# Patient Record
Sex: Female | Born: 1995 | Race: White | Hispanic: No | Marital: Single | Smoking: Never smoker
Health system: Southern US, Community
[De-identification: ages and names within clinical notes are randomized; demographics above are authoritative.]

## PROBLEM LIST (undated history)

## (undated) DIAGNOSIS — G43909 Migraine, unspecified, not intractable, without status migrainosus: Secondary | ICD-10-CM

## (undated) DIAGNOSIS — N83209 Unspecified ovarian cyst, unspecified side: Secondary | ICD-10-CM

## (undated) HISTORY — PX: WISDOM TOOTH EXTRACTION: SHX21

## (undated) HISTORY — PX: APPENDECTOMY: SHX54

---
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
## 2019-02-09 ENCOUNTER — Emergency Department: Payer: Medicaid Other

## 2019-02-09 ENCOUNTER — Emergency Department
Admission: EM | Admit: 2019-02-09 | Discharge: 2019-02-09 | Disposition: A | Discharge status: Home / Self Care | Payer: Medicaid Other | Attending: Student in an Organized Health Care Education/Training Program | Admitting: Student in an Organized Health Care Education/Training Program

## 2019-02-09 ENCOUNTER — Other Ambulatory Visit: Payer: Self-pay

## 2019-02-09 DIAGNOSIS — N939 Abnormal uterine and vaginal bleeding, unspecified: Secondary | ICD-10-CM

## 2019-02-09 HISTORY — DX: Migraine, unspecified, not intractable, without status migrainosus: G43.909

## 2019-02-09 HISTORY — DX: Unspecified ovarian cyst, unspecified side: N83.209

## 2019-02-09 LAB — CBC
HCT: 38.5 % (ref 36.0–46.0)
Hemoglobin: 12.6 g/dL (ref 12.0–15.0)
MCH: 29.2 pg (ref 26.0–34.0)
MCHC: 32.7 g/dL (ref 30.0–36.0)
MCV: 89.3 fL (ref 80.0–100.0)
Platelets: 454 10*3/uL — ABNORMAL HIGH (ref 150–400)
RBC: 4.31 MIL/uL (ref 3.87–5.11)
RDW: 12.9 % (ref 11.5–15.5)
WBC: 13.5 10*3/uL — ABNORMAL HIGH (ref 4.0–10.5)
nRBC: 0 % (ref 0.0–0.2)

## 2019-02-09 LAB — BASIC METABOLIC PANEL
Anion gap: 9 (ref 5–15)
BUN: 7 mg/dL (ref 6–20)
CO2: 23 mmol/L (ref 22–32)
Calcium: 9 mg/dL (ref 8.9–10.3)
Chloride: 107 mmol/L (ref 98–111)
Creatinine, Ser: 0.57 mg/dL (ref 0.44–1.00)
GFR calc Af Amer: >60 mL/min (ref >60)
GFR calc non Af Amer: >60 mL/min (ref >60)
Glucose, Bld: 112 mg/dL — ABNORMAL HIGH (ref 70–99)
Potassium: 3.5 mmol/L (ref 3.5–5.1)
Sodium: 139 mmol/L (ref 135–145)

## 2019-02-09 LAB — URINALYSIS, COMPLETE (UACMP) WITH MICROSCOPIC
Bacteria, UA: NONE SEEN
Bilirubin Urine: NEGATIVE
Glucose, UA: NEGATIVE mg/dL
Ketones, ur: NEGATIVE mg/dL
Leukocytes,Ua: NEGATIVE
Nitrite: NEGATIVE
Protein, ur: NEGATIVE mg/dL
Specific Gravity, Urine: 1.014 (ref 1.005–1.030)
pH: 5 (ref 5.0–8.0)

## 2019-02-09 LAB — HCG, QUANTITATIVE, PREGNANCY: hCG, Beta Chain, Quant, S: <1 m[IU]/mL (ref <5)

## 2019-02-09 MED ORDER — PROMETHAZINE HCL 25 MG PO TABS: 25.0000 mg | ORAL_TABLET | Freq: Four times a day (QID) | ORAL | 0 refills | Status: DC | PRN

## 2019-02-09 MED ORDER — TRANEXAMIC ACID 650 MG PO TABS: 1300.0000 mg | ORAL_TABLET | Freq: Three times a day (TID) | ORAL | 0 refills | Status: AC

## 2019-02-09 NOTE — ED Notes (Signed)
Patient transported to Ultrasound 

## 2019-02-09 NOTE — ED Triage Notes (Signed)
Pt states she normally has her periods in the first week of the month, states she had a slightly positive home pregnancy test, but today started having heavy vaginal bleeding with abd cramping today. States she is passing clots.

## 2019-02-09 NOTE — ED Notes (Signed)
Esign not working at this time. Pt verbalized discharge instructions and has no questions at this time. 

## 2019-02-09 NOTE — ED Provider Notes (Signed)
[facility] Emergency Department Provider Note    First MD Initiated Contact with Patient 02/09/19 1641     (approximate)  I have reviewed the triage vital signs and the nursing notes.   HISTORY  Chief Complaint Vaginal Bleeding    HPI Ellen Hart is a 23 y.o. female below listed past medical history presents the ER for heavy vaginal bleeding that started this morning.  States that she was 2 weeks late for her menstrual cycle.  States that she did take a home pregnancy test which was reportedly weakly positive a week ago but took another one this morning when she started having cramping and abdominal pain and it was negative.  She denies blood thinners. Oral contraceptive pills.  Does have a history of miscarriage several years ago.    Past Medical History:  Diagnosis Date  . Migraine   . Ovarian cyst    No family history on file. Past Surgical History:  Procedure Laterality Date  . APPENDECTOMY    . WISDOM TOOTH EXTRACTION     There are no active problems to display for this patient.     Prior to Admission medications   Medication Sig Start Date End Date Taking? Authorizing Provider  promethazine (PHENERGAN) 25 MG tablet Take 1 tablet (25 mg total) by mouth every 6 (six) hours as needed. 02/09/19   Ellen Hart, Keltin Baird, MD  tranexamic acid (LYSTEDA) 650 MG TABS tablet Take 2 tablets (1,300 mg total) by mouth 3 (three) times daily for 5 days. 02/09/19 02/14/19  Ellen Hart, Ellen Glandon, MD    Allergies Amoxicillin    Social History Social History   Tobacco Use  . Smoking status: Never Smoker  . Smokeless tobacco: Never Used  Substance Use Topics  . Alcohol use: Yes  . Drug use: Not Currently    Review of Systems Patient denies headaches, rhinorrhea, blurry vision, numbness, shortness of breath, chest pain, edema, cough, abdominal pain, nausea, vomiting, diarrhea, dysuria, fevers, rashes or hallucinations unless otherwise stated above in HPI.  ____________________________________________   PHYSICAL EXAM:  VITAL SIGNS: Vitals:   02/09/19 1449  BP: (!) 143/66  Pulse: 96  Resp: 18  Temp: 98.7 F (37.1 C)  SpO2: 98%    Constitutional: Alert and oriented.  Eyes: Conjunctivae are normal.  Head: Atraumatic. Nose: No congestion/rhinnorhea. Mouth/Throat: Mucous membranes are moist.   Neck: No stridor. Painless ROM.  Cardiovascular: Normal rate, regular rhythm. Grossly normal heart sounds.  Good peripheral circulation. Respiratory: Normal respiratory effort.  No retractions. Lungs CTAB. Gastrointestinal: Soft and nontender. No distention. No abdominal bruits. No CVA tenderness. Genitourinary: Small amount of oozing from the cervical loss.  No cervical motion tenderness erythema or laceration.  No masses.  No purulent discharge. Musculoskeletal: No lower extremity tenderness nor edema.  No joint effusions. Neurologic:  Normal speech and language. No gross focal neurologic deficits are appreciated. No facial droop Skin:  Skin is warm, dry and intact. No rash noted. Psychiatric: Mood and affect are normal. Speech and behavior are normal.  ____________________________________________   LABS (all labs ordered are listed, but only abnormal results are displayed)  Results for orders placed or performed during the hospital encounter of 02/09/19 (from the past 24 hour(s))  CBC     Status: Abnormal   Collection Time: 02/09/19  2:55 PM  Result Value Ref Range   WBC 13.5 (H) 4.0 - 10.5 K/uL   RBC 4.31 3.87 - 5.11 MIL/uL   Hemoglobin 12.6 12.0 - 15.0 g/dL  HCT 38.5 36.0 - 46.0 %   MCV 89.3 80.0 - 100.0 fL   MCH 29.2 26.0 - 34.0 pg   MCHC 32.7 30.0 - 36.0 g/dL   RDW 38.1 01.7 - 51.0 %   Platelets 454 (H) 150 - 400 K/uL   nRBC 0.0 0.0 - 0.2 %  Basic metabolic panel     Status: Abnormal   Collection Time: 02/09/19  2:55 PM  Result Value Ref Range   Sodium 139 135 - 145 mmol/L   Potassium 3.5 3.5 - 5.1 mmol/L   Chloride 107  98 - 111 mmol/L   CO2 23 22 - 32 mmol/L   Glucose, Bld 112 (H) 70 - 99 mg/dL   BUN 7 6 - 20 mg/dL   Creatinine, Ser 2.58 0.44 - 1.00 mg/dL   Calcium 9.0 8.9 - 52.7 mg/dL   GFR calc non Af Amer >60 >60 mL/min   GFR calc Af Amer >60 >60 mL/min   Anion gap 9 5 - 15  hCG, quantitative, pregnancy     Status: None   Collection Time: 02/09/19  2:55 PM  Result Value Ref Range   hCG, Beta Chain, Quant, S <1 <5 mIU/mL  Urinalysis, Complete w Microscopic     Status: Abnormal   Collection Time: 02/09/19  2:55 PM  Result Value Ref Range   Color, Urine YELLOW (A) YELLOW   APPearance CLEAR (A) CLEAR   Specific Gravity, Urine 1.014 1.005 - 1.030   pH 5.0 5.0 - 8.0   Glucose, UA NEGATIVE NEGATIVE mg/dL   Hgb urine dipstick LARGE (A) NEGATIVE   Bilirubin Urine NEGATIVE NEGATIVE   Ketones, ur NEGATIVE NEGATIVE mg/dL   Protein, ur NEGATIVE NEGATIVE mg/dL   Nitrite NEGATIVE NEGATIVE   Leukocytes,Ua NEGATIVE NEGATIVE   RBC / HPF 0-5 0 - 5 RBC/hpf   WBC, UA 0-5 0 - 5 WBC/hpf   Bacteria, UA NONE SEEN NONE SEEN   Squamous Epithelial / LPF 0-5 0 - 5   Mucus PRESENT    ____________________________________________ ____________________________________________  RADIOLOGY  I personally reviewed all radiographic images ordered to evaluate for the above acute complaints and reviewed radiology reports and findings.  These findings were personally discussed with the patient.  Please see medical record for radiology report.  ____________________________________________   PROCEDURES  Procedure(s) performed:  Procedures    Critical Care performed: no ____________________________________________   INITIAL IMPRESSION / ASSESSMENT AND PLAN / ED COURSE  Pertinent labs & imaging results that were available during my care of the patient were reviewed by me and considered in my medical decision making (see chart for details).   DDX: aub, dub, ectopic, retained poc  [name] is a 23 y.o. who  presents to the ED with symptoms as described above.  Patient afebrile hemodynamically stable.  Nontoxic appearing.  Abdominal exam is benign.  Clinical Course as of Feb 09 1935  Tue Feb 09, 2019  1739 Pelvic exam with retained products or cervical abnormality.  Based on her bleeding with possible miscarriage will order ultrasound to evaluate for anatomic abnormality.   [PR]  1932 Ultrasound is reassuring.  After discussing options for treatment patient has elected a trial of Lysteda and requesting outpatient referral which I think is perfectly reasonable.  Have discussed with the patient and available family all diagnostics and treatments performed thus far and all questions were answered to the best of my ability. The patient demonstrates understanding and agreement with plan.    [PR]  Clinical Course User Index [PR] Merlyn Lot, MD    The patient was evaluated in Emergency Department today for the symptoms described in the history of present illness. He/she was evaluated in the context of the global COVID-19 pandemic, which necessitated consideration that the patient might be at risk for infection with the SARS-CoV-2 virus that causes COVID-19. Institutional protocols and algorithms that pertain to the evaluation of patients at risk for COVID-19 are in a state of rapid change based on information released by regulatory bodies including the CDC and federal and state organizations. These policies and algorithms were followed during the patient's care in the ED.  As part of my medical decision making, I reviewed the following data within the [place] notes reviewed and incorporated, Labs reviewed, notes from prior ED visits and [place] Controlled Substance Database   ____________________________________________   FINAL CLINICAL IMPRESSION(S) / ED DIAGNOSES  Final diagnoses:  Episode of heavy vaginal bleeding      NEW MEDICATIONS STARTED DURING THIS VISIT:   New Prescriptions   PROMETHAZINE (PHENERGAN) 25 MG TABLET    Take 1 tablet (25 mg total) by mouth every 6 (six) hours as needed.   TRANEXAMIC ACID (LYSTEDA) 650 MG TABS TABLET    Take 2 tablets (1,300 mg total) by mouth 3 (three) times daily for 5 days.     Note:  This document was prepared using Dragon voice recognition software and may include unintentional dictation errors.    Merlyn Lot, MD 02/09/19 [phone]

## 2019-04-10 ENCOUNTER — Other Ambulatory Visit: Payer: Self-pay

## 2019-04-10 ENCOUNTER — Encounter: Payer: Self-pay | Admitting: Emergency Medicine

## 2019-04-10 DIAGNOSIS — N760 Acute vaginitis: Secondary | ICD-10-CM | POA: Insufficient documentation

## 2019-04-10 DIAGNOSIS — O26891 Other specified pregnancy related conditions, first trimester: Secondary | ICD-10-CM | POA: Insufficient documentation

## 2019-04-10 DIAGNOSIS — Z3A01 Less than 8 weeks gestation of pregnancy: Secondary | ICD-10-CM | POA: Insufficient documentation

## 2019-04-10 DIAGNOSIS — R1032 Left lower quadrant pain: Secondary | ICD-10-CM | POA: Diagnosis not present

## 2019-04-10 DIAGNOSIS — B9689 Other specified bacterial agents as the cause of diseases classified elsewhere: Secondary | ICD-10-CM | POA: Insufficient documentation

## 2019-04-10 LAB — COMPREHENSIVE METABOLIC PANEL
ALT: 13 U/L (ref 0–44)
AST: 13 U/L — ABNORMAL LOW (ref 15–41)
Albumin: 3.8 g/dL (ref 3.5–5.0)
Alkaline Phosphatase: 83 U/L (ref 38–126)
Anion gap: 9 (ref 5–15)
BUN: 9 mg/dL (ref 6–20)
CO2: 22 mmol/L (ref 22–32)
Calcium: 8.9 mg/dL (ref 8.9–10.3)
Chloride: 106 mmol/L (ref 98–111)
Creatinine, Ser: 0.56 mg/dL (ref 0.44–1.00)
GFR calc Af Amer: >60 mL/min (ref >60)
GFR calc non Af Amer: >60 mL/min (ref >60)
Glucose, Bld: 100 mg/dL — ABNORMAL HIGH (ref 70–99)
Potassium: 3.8 mmol/L (ref 3.5–5.1)
Sodium: 137 mmol/L (ref 135–145)
Total Bilirubin: 0.5 mg/dL (ref 0.3–1.2)
Total Protein: 7.4 g/dL (ref 6.5–8.1)

## 2019-04-10 LAB — CBC
HCT: 34.7 % — ABNORMAL LOW (ref 36.0–46.0)
Hemoglobin: 11.8 g/dL — ABNORMAL LOW (ref 12.0–15.0)
MCH: 28.6 pg (ref 26.0–34.0)
MCHC: 34 g/dL (ref 30.0–36.0)
MCV: 84.2 fL (ref 80.0–100.0)
Platelets: 403 10*3/uL — ABNORMAL HIGH (ref 150–400)
RBC: 4.12 MIL/uL (ref 3.87–5.11)
RDW: 12.4 % (ref 11.5–15.5)
WBC: 12.5 10*3/uL — ABNORMAL HIGH (ref 4.0–10.5)
nRBC: 0 % (ref 0.0–0.2)

## 2019-04-10 LAB — URINALYSIS, COMPLETE (UACMP) WITH MICROSCOPIC
Bilirubin Urine: NEGATIVE
Glucose, UA: NEGATIVE mg/dL
Hgb urine dipstick: NEGATIVE
Ketones, ur: NEGATIVE mg/dL
Leukocytes,Ua: NEGATIVE
Nitrite: NEGATIVE
Protein, ur: NEGATIVE mg/dL
Specific Gravity, Urine: 1.01 (ref 1.005–1.030)
pH: 6 (ref 5.0–8.0)

## 2019-04-10 LAB — LIPASE, BLOOD: Lipase: 25 U/L (ref 11–51)

## 2019-04-10 LAB — POCT PREGNANCY, URINE: Preg Test, Ur: POSITIVE — AB

## 2019-04-10 NOTE — ED Triage Notes (Signed)
Patient states that she had a at home positive pregnancy test yesterday and today. Patient states that today around noon she started having lower abdominal pain. Patient denies vaginal bleeding or urinary symptoms.

## 2019-04-11 ENCOUNTER — Emergency Department
Admission: EM | Admit: 2019-04-11 | Discharge: 2019-04-11 | Disposition: A | Discharge status: Home / Self Care | Payer: Medicaid Other | Attending: Emergency Medicine | Admitting: Emergency Medicine

## 2019-04-11 ENCOUNTER — Emergency Department: Payer: Medicaid Other

## 2019-04-11 DIAGNOSIS — O3680X0 Pregnancy with inconclusive fetal viability, not applicable or unspecified: Secondary | ICD-10-CM

## 2019-04-11 DIAGNOSIS — B9689 Other specified bacterial agents as the cause of diseases classified elsewhere: Secondary | ICD-10-CM

## 2019-04-11 DIAGNOSIS — O26891 Other specified pregnancy related conditions, first trimester: Secondary | ICD-10-CM

## 2019-04-11 DIAGNOSIS — R109 Unspecified abdominal pain: Secondary | ICD-10-CM

## 2019-04-11 DIAGNOSIS — N760 Acute vaginitis: Secondary | ICD-10-CM

## 2019-04-11 DIAGNOSIS — R102 Pelvic and perineal pain: Secondary | ICD-10-CM

## 2019-04-11 LAB — WET PREP, GENITAL
Sperm: NONE SEEN
Trich, Wet Prep: NONE SEEN
Yeast Wet Prep HPF POC: NONE SEEN

## 2019-04-11 LAB — CHLAMYDIA/NGC RT PCR (ARMC ONLY)
Chlamydia Tr: NOT DETECTED
N gonorrhoeae: DETECTED — AB

## 2019-04-11 LAB — HCG, QUANTITATIVE, PREGNANCY: hCG, Beta Chain, Quant, S: 700 m[IU]/mL — ABNORMAL HIGH (ref <5)

## 2019-04-11 MED ORDER — METRONIDAZOLE 500 MG PO TABS
500.0000 mg | ORAL_TABLET | Freq: Once | ORAL | Status: AC
  Administered 2019-04-11: 500 mg via ORAL
  Filled 2019-04-11: qty 1

## 2019-04-11 MED ORDER — METRONIDAZOLE 500 MG PO TABS: 500.0000 mg | ORAL_TABLET | Freq: Two times a day (BID) | ORAL | 0 refills | Status: AC

## 2019-04-11 NOTE — ED Notes (Signed)
Patient transported to Ultrasound 

## 2019-04-11 NOTE — Discharge Instructions (Addendum)
As we discussed, we are unable to rule out an ectopic pregnancy at this time.  This means a pregnancy outside of the uterus which can cause significant problems including life-threatening bleeding.  Therefore it is very important that you follow-up with OB/GYN in 48 hours for repeat blood work and ultrasound.  If your OB is unable to see you within 48 hours and you are still having pain please return to the emergency room.  If the pain has resolved it is okay to wait a week to see your OB.  In the meantime take over-the-counter prenatal vitamins with folic acid.  If you have severe or new abdominal pain or vaginal bleeding please return to the emergency room immediately for reevaluation.

## 2019-04-11 NOTE — ED Provider Notes (Signed)
[facility] Emergency Department Provider Note  ____________________________________________  Time seen: Approximately 2:57 AM  I have reviewed the triage vital signs and the nursing notes.   HISTORY  Chief Complaint Abdominal Pain   HPI Ellen Hart is a 23 y.o. female G3P0 at  [redacted] weeks GA per LMP who presents for evaluation of abdominal pain.  Patient reports having 2+ pregnancy tests at home.  Earlier today she started having left lower quadrant abdominal pain that she describes as a tearing sensation.  At this time the pain has significantly improved and she has mild soreness.  No vaginal discharge or vaginal bleeding, no dysuria or hematuria, no diarrhea, no nausea or vomiting, no constipation.  Patient reports 2 prior spontaneous miscarriages.  Past Medical History:  Diagnosis Date   Migraine    Ovarian cyst     There are no active problems to display for this patient.   Past Surgical History:  Procedure Laterality Date   APPENDECTOMY     WISDOM TOOTH EXTRACTION      Prior to Admission medications   Medication Sig Start Date End Date Taking? Authorizing Provider  metroNIDAZOLE (FLAGYL) 500 MG tablet Take 1 tablet (500 mg total) by mouth 2 (two) times daily for 7 days. 04/11/19 04/18/19  Rudene Re, MD  promethazine (PHENERGAN) 25 MG tablet Take 1 tablet (25 mg total) by mouth every 6 (six) hours as needed. 02/09/19   Merlyn Lot, MD    Allergies Amoxicillin  No family history on file.  Social History Social History   Tobacco Use   Smoking status: Never Smoker   Smokeless tobacco: Never Used  Substance Use Topics   Alcohol use: Yes    Comment: occ   Drug use: Not Currently    Review of Systems  Constitutional: Negative for fever. Eyes: Negative for visual changes. ENT: Negative for sore throat. Neck: No neck pain  Cardiovascular: Negative for chest pain. Respiratory: Negative for shortness of  breath. Gastrointestinal: + LLQ abdominal pain. No vomiting or diarrhea. Genitourinary: Negative for dysuria. Musculoskeletal: Negative for back pain. Skin: Negative for rash. Neurological: Negative for headaches, weakness or numbness. Psych: No SI or HI  ____________________________________________   PHYSICAL EXAM:  VITAL SIGNS: ED Triage Vitals [04/10/19 2325]  Enc Vitals Group     BP 126/72     Pulse Rate 98     Resp 18     Temp 98.9 F (37.2 C)     Temp Source Oral     SpO2 99 %     Weight 187 lb (84.8 kg)     Height 5' 3.5" (1.613 m)     Head Circumference      Peak Flow      Pain Score 5     Pain Loc      Pain Edu?      Excl. in [place]?     Constitutional: Alert and oriented. Well appearing and in no apparent distress. HEENT:      Head: Normocephalic and atraumatic.         Eyes: Conjunctivae are normal. Sclera is non-icteric.       Mouth/Throat: Mucous membranes are moist.       Neck: Supple with no signs of meningismus. Cardiovascular: Regular rate and rhythm. No murmurs, gallops, or rubs. 2+ symmetrical distal pulses are present in all extremities. No JVD. Respiratory: Normal respiratory effort. Lungs are clear to auscultation bilaterally. No wheezes, crackles, or rhonchi.  Gastrointestinal: Soft, mild ttp over  the LLQ, and non distended with positive bowel sounds. No rebound or guarding. Pelvic exam: Normal external genitalia, no rashes or lesions. Normal cervical mucus. Os closed. No cervical motion tenderness.  Tender to palpation over the L adnexa Musculoskeletal: Nontender with normal range of motion in all extremities. No edema, cyanosis, or erythema of extremities. Neurologic: Normal speech and language. Face is symmetric. Moving all extremities. No gross focal neurologic deficits are appreciated. Skin: Skin is warm, dry and intact. No rash noted. Psychiatric: Mood and affect are normal. Speech and behavior are  normal.  ____________________________________________   LABS (all labs ordered are listed, but only abnormal results are displayed)  Labs Reviewed  WET PREP, GENITAL - Abnormal; Notable for the following components:      Result Value   Clue Cells Wet Prep HPF POC PRESENT (*)    WBC, Wet Prep HPF POC FEW (*)    All other components within normal limits  COMPREHENSIVE METABOLIC PANEL - Abnormal; Notable for the following components:   Glucose, Bld 100 (*)    AST 13 (*)    All other components within normal limits  CBC - Abnormal; Notable for the following components:   WBC 12.5 (*)    Hemoglobin 11.8 (*)    HCT 34.7 (*)    Platelets 403 (*)    All other components within normal limits  URINALYSIS, COMPLETE (UACMP) WITH MICROSCOPIC - Abnormal; Notable for the following components:   Color, Urine STRAW (*)    APPearance CLEAR (*)    Bacteria, UA RARE (*)    All other components within normal limits  HCG, QUANTITATIVE, PREGNANCY - Abnormal; Notable for the following components:   hCG, Beta Chain, Quant, S 700 (*)    All other components within normal limits  POCT PREGNANCY, URINE - Abnormal; Notable for the following components:   Preg Test, Ur POSITIVE (*)    All other components within normal limits  CHLAMYDIA/NGC RT PCR ([facility] ONLY)  LIPASE, BLOOD  POC URINE PREG, ED   ____________________________________________  EKG  none  ____________________________________________  RADIOLOGY  I have personally reviewed the images performed during this visit and I agree with the Radiologist's read.   Interpretation by Radiologist:  US Ob Less Than 14 Weeks With Ob Transvaginal  Result Date: 04/11/2019 CLINICAL DATA:  Pelvic pain EXAM: OBSTETRIC <14 WK [place] AND TRANSVAGINAL OB US TECHNIQUE: Both transabdominal and transvaginal ultrasound examinations were performed for complete evaluation of the gestation as well as the maternal uterus, adnexal regions, and pelvic  cul-de-sac. Transvaginal technique was performed to assess early pregnancy. COMPARISON:  None. FINDINGS: Intrauterine gestational sac: None Yolk sac:  Not visualized Embryo:  Not visualized Cardiac Activity: Heart Rate:   bpm MSD:   mm    w     d CRL:    mm    w    d                  [place] EDC: Subchorionic hemorrhage:  None visualized. Maternal uterus/adnexae: No adnexal mass.  Trace free fluid. IMPRESSION: No intrauterine pregnancy visualized. Differential considerations would include early intrauterine pregnancy too early to visualize, spontaneous abortion, or occult ectopic pregnancy. Recommend close clinical followup and serial quantitative beta HCGs and ultrasounds. Electronically Signed   By: Charlett Nose M.D.   On: 04/11/2019 02:19     ____________________________________________   PROCEDURES  Procedure(s) performed: None Procedures Critical Care performed:  None ____________________________________________   INITIAL IMPRESSION / ASSESSMENT AND PLAN /  ED COURSE  23 y.o. female G3P0 at  [redacted] weeks GA per LMP who presents for evaluation of LLQ abdominal pain and a + home pregnancy test. LMP 5 weeks ago. hcg 700. Pelvic US unable to visualize a pregnancy at this time.  On exam she has mild left adnexal tenderness but patient is otherwise well-appearing with a nonrigid and benign abdominal exam.  Discussed with the radiologist Dr. Kearney Hardover who sees normal blood flow to the left ovary with no signs of torsion.  There are no cysts seen on the ovary as well.  Discussed with the patient that at this time we are unable to rule out an ectopic pregnancy and therefore it is imperative that she has follow-up in 48 hours for repeat ultrasound and beta quant.  Patient follows up with her notable OB/GYN.  I told her that if she is unable to get in with OB/GYN that she should return to the emergency room in 48 hours or sooner if she has severe abdominal pain or syncope.  Discussed prenatal care with patient.  Wet prep  positive for BV for which patient was started on Flagyl.  GC and Chlamydia are pending.      As part of my medical decision making, I reviewed the following data within the electronic MEDICAL RECORD NUMBER Nursing notes reviewed and incorporated, Labs reviewed , Old chart reviewed, Radiograph reviewed , Discussed with radiologist, Notes from prior ED visits and [place] Controlled Substance Database   Please note:  Patient was evaluated in Emergency Department today for the symptoms described in the history of present illness. Patient was evaluated in the context of the global COVID-19 pandemic, which necessitated consideration that the patient might be at risk for infection with the SARS-CoV-2 virus that causes COVID-19. Institutional protocols and algorithms that pertain to the evaluation of patients at risk for COVID-19 are in a state of rapid change based on information released by regulatory bodies including the CDC and federal and state organizations. These policies and algorithms were followed during the patient's care in the ED.  Some ED evaluations and interventions may be delayed as a result of limited staffing during the pandemic.   ____________________________________________   FINAL CLINICAL IMPRESSION(S) / ED DIAGNOSES   Final diagnoses:  Abdominal pain during pregnancy in first trimester  Pregnancy, location unknown  BV (bacterial vaginosis)      NEW MEDICATIONS STARTED DURING THIS VISIT:  ED Discharge Orders         Ordered    metroNIDAZOLE (FLAGYL) 500 MG tablet  2 times daily     04/11/19 0341           Note:  This document was prepared using Dragon voice recognition software and may include unintentional dictation errors.    Don PerkingVeronese, [place], MD 04/11/19 [phone]

## 2019-04-11 NOTE — ED Notes (Signed)
Pt may drink per EDP. Pt given water per pt's request. Pt tolerating well.

## 2019-04-12 ENCOUNTER — Telehealth: Payer: Self-pay | Admitting: Emergency Medicine

## 2019-04-12 NOTE — Telephone Encounter (Signed)
Called patient and she is aware of std results.  She is trying to get in touch with her doctor--schermerhorn and is having trouble gettign through.   Told her to just wait for call to be answered and  It sometimes takes >15 minutes. Told her that she can also be seen at [facility] to have hcg and std treatment.

## 2019-04-26 ENCOUNTER — Other Ambulatory Visit: Payer: Self-pay

## 2019-04-26 ENCOUNTER — Encounter: Payer: Self-pay | Admitting: Emergency Medicine

## 2019-04-26 ENCOUNTER — Emergency Department
Admission: EM | Admit: 2019-04-26 | Discharge: 2019-04-26 | Disposition: A | Discharge status: Home / Self Care | Payer: Medicaid Other | Attending: Emergency Medicine | Admitting: Emergency Medicine

## 2019-04-26 DIAGNOSIS — O9A211 Injury, poisoning and certain other consequences of external causes complicating pregnancy, first trimester: Secondary | ICD-10-CM | POA: Diagnosis not present

## 2019-04-26 DIAGNOSIS — Z3A01 Less than 8 weeks gestation of pregnancy: Secondary | ICD-10-CM | POA: Insufficient documentation

## 2019-04-26 DIAGNOSIS — R109 Unspecified abdominal pain: Secondary | ICD-10-CM | POA: Insufficient documentation

## 2019-04-26 LAB — URINALYSIS, COMPLETE (UACMP) WITH MICROSCOPIC
Bilirubin Urine: NEGATIVE
Glucose, UA: NEGATIVE mg/dL
Hgb urine dipstick: NEGATIVE
Ketones, ur: NEGATIVE mg/dL
Leukocytes,Ua: NEGATIVE
Nitrite: NEGATIVE
Protein, ur: NEGATIVE mg/dL
Specific Gravity, Urine: 1.021 (ref 1.005–1.030)
pH: 5 (ref 5.0–8.0)

## 2019-04-26 LAB — POCT PREGNANCY, URINE: Preg Test, Ur: POSITIVE — AB

## 2019-04-26 NOTE — ED Notes (Signed)
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
## 2019-05-02 ENCOUNTER — Emergency Department
Admission: EM | Admit: 2019-05-02 | Discharge: 2019-05-02 | Disposition: A | Discharge status: Home / Self Care | Payer: Medicaid Other | Attending: Emergency Medicine | Admitting: Emergency Medicine

## 2019-05-02 ENCOUNTER — Other Ambulatory Visit: Payer: Self-pay

## 2019-05-02 DIAGNOSIS — G44209 Tension-type headache, unspecified, not intractable: Secondary | ICD-10-CM | POA: Diagnosis not present

## 2019-05-02 DIAGNOSIS — Z20828 Contact with and (suspected) exposure to other viral communicable diseases: Secondary | ICD-10-CM | POA: Insufficient documentation

## 2019-05-02 DIAGNOSIS — R519 Headache, unspecified: Secondary | ICD-10-CM | POA: Diagnosis present

## 2019-05-02 DIAGNOSIS — Z20822 Contact with and (suspected) exposure to covid-19: Secondary | ICD-10-CM

## 2019-05-02 LAB — CBC WITH DIFFERENTIAL/PLATELET
Abs Immature Granulocytes: 0.03 10*3/uL (ref 0.00–0.07)
Basophils Absolute: 0 10*3/uL (ref 0.0–0.1)
Basophils Relative: 0 %
Eosinophils Absolute: 0 10*3/uL (ref 0.0–0.5)
Eosinophils Relative: 0 %
HCT: 35.9 % — ABNORMAL LOW (ref 36.0–46.0)
Hemoglobin: 12 g/dL (ref 12.0–15.0)
Immature Granulocytes: 1 %
Lymphocytes Relative: 14 %
Lymphs Abs: 0.8 10*3/uL (ref 0.7–4.0)
MCH: 28.8 pg (ref 26.0–34.0)
MCHC: 33.4 g/dL (ref 30.0–36.0)
MCV: 86.3 fL (ref 80.0–100.0)
Monocytes Absolute: 0.4 10*3/uL (ref 0.1–1.0)
Monocytes Relative: 6 %
Neutro Abs: 4.4 10*3/uL (ref 1.7–7.7)
Neutrophils Relative %: 79 %
Platelets: 332 10*3/uL (ref 150–400)
RBC: 4.16 MIL/uL (ref 3.87–5.11)
RDW: 12.5 % (ref 11.5–15.5)
WBC: 5.6 10*3/uL (ref 4.0–10.5)
nRBC: 0 % (ref 0.0–0.2)

## 2019-05-02 LAB — URINALYSIS, COMPLETE (UACMP) WITH MICROSCOPIC
Bacteria, UA: NONE SEEN
Bilirubin Urine: NEGATIVE
Glucose, UA: NEGATIVE mg/dL
Hgb urine dipstick: NEGATIVE
Ketones, ur: NEGATIVE mg/dL
Leukocytes,Ua: NEGATIVE
Nitrite: NEGATIVE
Protein, ur: NEGATIVE mg/dL
Specific Gravity, Urine: 1.006 (ref 1.005–1.030)
pH: 7 (ref 5.0–8.0)

## 2019-05-02 LAB — BASIC METABOLIC PANEL
Anion gap: 8 (ref 5–15)
BUN: 6 mg/dL (ref 6–20)
CO2: 24 mmol/L (ref 22–32)
Calcium: 8.8 mg/dL — ABNORMAL LOW (ref 8.9–10.3)
Chloride: 103 mmol/L (ref 98–111)
Creatinine, Ser: 0.5 mg/dL (ref 0.44–1.00)
GFR calc Af Amer: >60 mL/min (ref >60)
GFR calc non Af Amer: >60 mL/min (ref >60)
Glucose, Bld: 103 mg/dL — ABNORMAL HIGH (ref 70–99)
Potassium: 3.4 mmol/L — ABNORMAL LOW (ref 3.5–5.1)
Sodium: 135 mmol/L (ref 135–145)

## 2019-05-02 LAB — POC SARS CORONAVIRUS 2 AG: SARS Coronavirus 2 Ag: NEGATIVE

## 2019-05-02 LAB — POCT PREGNANCY, URINE: Preg Test, Ur: POSITIVE — AB

## 2019-05-02 MED ORDER — SODIUM CHLORIDE 0.9 % IV BOLUS
1000.0000 mL | Freq: Once | INTRAVENOUS | Status: AC
  Administered 2019-05-02: 1000 mL via INTRAVENOUS

## 2019-05-02 MED ORDER — DEXAMETHASONE SODIUM PHOSPHATE 10 MG/ML IJ SOLN
10.0000 mg | Freq: Once | INTRAMUSCULAR | Status: AC
  Administered 2019-05-02: 10 mg via INTRAVENOUS
  Filled 2019-05-02: qty 1

## 2019-05-02 MED ORDER — ONDANSETRON HCL 4 MG/2ML IJ SOLN
4.0000 mg | Freq: Once | INTRAMUSCULAR | Status: AC
  Administered 2019-05-02: 4 mg via INTRAVENOUS
  Filled 2019-05-02: qty 2

## 2019-05-02 MED ORDER — MORPHINE SULFATE (PF) 4 MG/ML IV SOLN
4.0000 mg | Freq: Once | INTRAVENOUS | Status: AC
  Administered 2019-05-02: 4 mg via INTRAVENOUS
  Filled 2019-05-02: qty 1

## 2019-05-02 NOTE — ED Notes (Signed)
Provider at bedside

## 2019-05-02 NOTE — ED Notes (Addendum)
Pt reporting headache x 5 days. Pt has been able to eat and drink normally and reports she has been drinking more water than normal attempting to relieve headache. Sensitivity to light. No neurologic deficits. Tylenol at home without relief and no complications with pregnancy to this point.
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
## 2019-05-03 LAB — SARS CORONAVIRUS 2 (TAT 6-24 HRS): SARS Coronavirus 2: NEGATIVE

## 2019-05-14 NOTE — L&D Delivery Note (Signed)
Delivery Note  Date of delivery: 12/18/2019 Estimated Date of Delivery: 12/18/19 Patient's last menstrual period was 03/10/2019 (approximate). EGA: [redacted]w[redacted]d  Delivery Note At 4:58 PM a viable female was delivered via Vaginal, Spontaneous (Presentation: OA).  APGAR: 8, 9; weight  8lb9.2oz (3890g).   Placenta status: spontaneous, Intact with a 3VC   with the following complications:  none.    First Stage: Labor onset:  Augmentation : Pitocin Analgesia /Anesthesia intrapartum: Epidural AROM at 0841  Maia Plan presented to L&D with PROM. She was augmented with pitocin. Epidural placed.   Second Stage: Complete dilation at 1502 Onset of pushing at 1628 FHR second stage Cat II Delivery at 1658 on 12/18/2019  She progressed to complete and had a spontaneous vaginal birth of a live female over an intact perineum. The fetal head was delivered in OA position with restitution to LOA. No nuchal cord. Anterior then posterior shoulders delivered spontaneously. Baby placed on mom's abdomen and attended to by transition RN. Cord clamped and cut when pulseless by FOB. Cord blood obtained for newborn labs.  Third Stage: Placenta delivered  intact with 3VC at 1659 Placenta disposition: routine disposal Uterine tone firm / bleeding min IV pitocin given for hemorrhage prophylaxis  2nd laceration identified  Anesthesia for repair: Epidural Repair 3.0 vycrl Est. Blood Loss (mL): 250  Complications: none  Mom to postpartum.  Baby to Couplet care / Skin to Skin.  Newborn: Birth Weight: pending  Apgar Scores: 8,9 Feeding planned: Breastfeeding "[place]"  Cyril Mourning, [place] 12/18/2019 5:26 PM

## 2019-05-21 LAB — OB RESULTS CONSOLE VARICELLA ZOSTER ANTIBODY, IGG: Varicella: IMMUNE

## 2019-05-21 LAB — OB RESULTS CONSOLE HEPATITIS B SURFACE ANTIGEN: Hepatitis B Surface Ag: NEGATIVE

## 2019-05-21 LAB — OB RESULTS CONSOLE RUBELLA ANTIBODY, IGM: Rubella: IMMUNE

## 2019-09-04 ENCOUNTER — Emergency Department
Admission: EM | Admit: 2019-09-04 | Discharge: 2019-09-04 | Disposition: A | Discharge status: Home / Self Care | Payer: Medicaid Other | Attending: Emergency Medicine | Admitting: Emergency Medicine

## 2019-09-04 ENCOUNTER — Encounter: Payer: Self-pay | Admitting: Emergency Medicine

## 2019-09-04 ENCOUNTER — Other Ambulatory Visit: Payer: Self-pay

## 2019-09-04 DIAGNOSIS — R8271 Bacteriuria: Secondary | ICD-10-CM | POA: Diagnosis not present

## 2019-09-04 DIAGNOSIS — Z3A25 25 weeks gestation of pregnancy: Secondary | ICD-10-CM | POA: Insufficient documentation

## 2019-09-04 DIAGNOSIS — R519 Headache, unspecified: Secondary | ICD-10-CM | POA: Insufficient documentation

## 2019-09-04 DIAGNOSIS — O99891 Other specified diseases and conditions complicating pregnancy: Secondary | ICD-10-CM | POA: Diagnosis not present

## 2019-09-04 DIAGNOSIS — R110 Nausea: Secondary | ICD-10-CM | POA: Diagnosis not present

## 2019-09-04 LAB — CBC WITH DIFFERENTIAL/PLATELET
Abs Immature Granulocytes: 0.11 10*3/uL — ABNORMAL HIGH (ref 0.00–0.07)
Basophils Absolute: 0 10*3/uL (ref 0.0–0.1)
Basophils Relative: 0 %
Eosinophils Absolute: 0 10*3/uL (ref 0.0–0.5)
Eosinophils Relative: 0 %
HCT: 31.1 % — ABNORMAL LOW (ref 36.0–46.0)
Hemoglobin: 10.3 g/dL — ABNORMAL LOW (ref 12.0–15.0)
Immature Granulocytes: 1 %
Lymphocytes Relative: 16 %
Lymphs Abs: 1.9 10*3/uL (ref 0.7–4.0)
MCH: 30 pg (ref 26.0–34.0)
MCHC: 33.1 g/dL (ref 30.0–36.0)
MCV: 90.7 fL (ref 80.0–100.0)
Monocytes Absolute: 0.7 10*3/uL (ref 0.1–1.0)
Monocytes Relative: 6 %
Neutro Abs: 9.1 10*3/uL — ABNORMAL HIGH (ref 1.7–7.7)
Neutrophils Relative %: 77 %
Platelets: 338 10*3/uL (ref 150–400)
RBC: 3.43 MIL/uL — ABNORMAL LOW (ref 3.87–5.11)
RDW: 12.7 % (ref 11.5–15.5)
WBC: 11.9 10*3/uL — ABNORMAL HIGH (ref 4.0–10.5)
nRBC: 0 % (ref 0.0–0.2)

## 2019-09-04 LAB — COMPREHENSIVE METABOLIC PANEL
ALT: 16 U/L (ref 0–44)
AST: 18 U/L (ref 15–41)
Albumin: 2.8 g/dL — ABNORMAL LOW (ref 3.5–5.0)
Alkaline Phosphatase: 83 U/L (ref 38–126)
Anion gap: 7 (ref 5–15)
BUN: 6 mg/dL (ref 6–20)
CO2: 23 mmol/L (ref 22–32)
Calcium: 8.4 mg/dL — ABNORMAL LOW (ref 8.9–10.3)
Chloride: 106 mmol/L (ref 98–111)
Creatinine, Ser: 0.33 mg/dL — ABNORMAL LOW (ref 0.44–1.00)
GFR calc Af Amer: >60 mL/min (ref >60)
GFR calc non Af Amer: >60 mL/min (ref >60)
Glucose, Bld: 85 mg/dL (ref 70–99)
Potassium: 3.8 mmol/L (ref 3.5–5.1)
Sodium: 136 mmol/L (ref 135–145)
Total Bilirubin: 0.4 mg/dL (ref 0.3–1.2)
Total Protein: 6.8 g/dL (ref 6.5–8.1)

## 2019-09-04 LAB — URINALYSIS, COMPLETE (UACMP) WITH MICROSCOPIC
Bilirubin Urine: NEGATIVE
Glucose, UA: NEGATIVE mg/dL
Hgb urine dipstick: NEGATIVE
Ketones, ur: NEGATIVE mg/dL
Leukocytes,Ua: NEGATIVE
Nitrite: NEGATIVE
Protein, ur: NEGATIVE mg/dL
Specific Gravity, Urine: 1.02 (ref 1.005–1.030)
pH: 8.5 — ABNORMAL HIGH (ref 5.0–8.0)

## 2019-09-04 MED ORDER — SODIUM CHLORIDE 0.9% FLUSH: 3.0000 mL | Freq: Once | INTRAVENOUS | Status: DC

## 2019-09-04 MED ORDER — METOCLOPRAMIDE HCL 10 MG PO TABS
10.0000 mg | ORAL_TABLET | Freq: Once | ORAL | Status: AC
  Administered 2019-09-04: 10 mg via ORAL
  Filled 2019-09-04: qty 1

## 2019-09-04 MED ORDER — SODIUM CHLORIDE 0.9 % IV BOLUS
1000.0000 mL | Freq: Once | INTRAVENOUS | Status: AC
  Administered 2019-09-04: 1000 mL via INTRAVENOUS

## 2019-09-04 MED ORDER — METOCLOPRAMIDE HCL 10 MG PO TABS: 10.0000 mg | ORAL_TABLET | Freq: Three times a day (TID) | ORAL | 1 refills | Status: DC

## 2019-09-04 MED ORDER — CEPHALEXIN 500 MG PO CAPS: 500.0000 mg | ORAL_CAPSULE | Freq: Three times a day (TID) | ORAL | 0 refills | Status: DC

## 2019-09-04 MED ORDER — PROMETHAZINE HCL 25 MG PO TABS: 25.0000 mg | ORAL_TABLET | Freq: Four times a day (QID) | ORAL | 0 refills | Status: DC | PRN

## 2019-09-04 MED ORDER — PROMETHAZINE HCL 25 MG PO TABS
25.0000 mg | ORAL_TABLET | Freq: Once | ORAL | Status: AC
  Administered 2019-09-04: 25 mg via ORAL
  Filled 2019-09-04: qty 1

## 2019-09-04 NOTE — Discharge Instructions (Addendum)
Call your OB/GYN on Monday morning if any continued problems to discuss further medication.  Increase fluids to stay hydrated.  You may take Tylenol with the Reglan for your headache.  Take only as directed.  Also the Phenergan was sent to the pharmacy as needed for nausea.  Continue to try to limit the amount of Tylenol that you are taking as you have been doing in the past.  Also urine indicates that you may have an early urinary tract infection without symptoms at this time.  This also may be the cause of some nausea.  A prescription for Keflex 500 mg 3 times daily was sent to your pharmacy.  Increase fluids.  A culture also was sent to the lab and your OB/GYN can see the results of this and change antibiotics if needed.

## 2019-09-04 NOTE — ED Notes (Signed)
Pt assisted to toilet and provided warm blanket.

## 2019-09-04 NOTE — ED Notes (Signed)
Pt states she will have to wait an hour for the prescriptions to be filled and states that she has never left the ER in pain and appears. Will speak with EDP.

## 2019-09-04 NOTE — ED Triage Notes (Signed)
Pt to ED via POV c/o headache and nausea since Tuesday. Pt states that she has taken max amount of tylenol and is unable to take anymore. Pt states that she is [redacted] weeks pregnant. Pt denies abd pain. Pt states that she has hx/o headaches. Pt is in NAD.

## 2019-09-04 NOTE — ED Provider Notes (Signed)
[facility] Emergency Department Provider Note  ____________________________________________   First MD Initiated Contact with Patient 09/04/19 1116     (approximate)  I have reviewed the triage vital signs and the nursing notes.   HISTORY  Chief Complaint Headache and Nausea   HPI Ellen Hart is a 24 y.o. female presents to the ED with complaint of headache and nausea for the last 4 days.  Patient states that she has a history of migraines and has taken the maximum amount of Tylenol that she is able to take while being [redacted] weeks pregnant.  Patient also has nausea with some light sensitivity.  Patient states in the last 4 days she has only vomited twice.  She no longer has any Phenergan that her OB/GYN prescribed for her to control the nausea.  She states that this headache is no different than her normal.  She points to her forehead when explaining that her headache is here and nonradiating.  She denies any known exposure to Covid.       Past Medical History:  Diagnosis Date  . Migraine   . Ovarian cyst     There are no problems to display for this patient.   Past Surgical History:  Procedure Laterality Date  . APPENDECTOMY    . WISDOM TOOTH EXTRACTION      Prior to Admission medications   Medication Sig Start Date End Date Taking? Authorizing Provider  cephALEXin (KEFLEX) 500 MG capsule Take 1 capsule (500 mg total) by mouth 3 (three) times daily. 09/04/19   Tommi Rumps, PA-C  metoCLOPramide (REGLAN) 10 MG tablet Take 1 tablet (10 mg total) by mouth 3 (three) times daily with meals. 09/04/19 09/03/20  Tommi Rumps, PA-C  promethazine (PHENERGAN) 25 MG tablet Take 1 tablet (25 mg total) by mouth every 6 (six) hours as needed. 09/04/19   Tommi Rumps, PA-C    Allergies Amoxicillin  No family history on file.  Social History Social History   Tobacco Use  . Smoking status: Never Smoker  . Smokeless tobacco: Never Used   Substance Use Topics  . Alcohol use: Yes    Comment: occ  . Drug use: Not Currently    Review of Systems Constitutional: No fever/chills Eyes: No visual changes. ENT: No sore throat.  Negative for ear pain.  Negative for sore throat. Cardiovascular: Denies chest pain. Respiratory: Denies shortness of breath. Gastrointestinal: No abdominal pain.  Positive nausea, history of vomiting.  No diarrhea.  No constipation. Genitourinary: Negative for dysuria. Musculoskeletal: Negative for back pain. Skin: Negative for rash. Neurological: Positive for headaches, negative for focal weakness or numbness. ____________________________________________   PHYSICAL EXAM:  VITAL SIGNS: ED Triage Vitals [09/04/19 1038]  Enc Vitals Group     BP (!) 110/51     Pulse Rate 92     Resp 16     Temp 98.5 F (36.9 C)     Temp Source Oral     SpO2 98 %     Weight 206 lb (93.4 kg)     Height 5\' 3"  (1.6 m)     Head Circumference      Peak Flow      Pain Score 7     Pain Loc      Pain Edu?      Excl. in GC?     Constitutional: Alert and oriented. Well appearing and in no acute distress. Eyes: Conjunctivae are normal. PERRL. EOMI. Head: Atraumatic. Neck: No stridor.  No cervical tenderness on palpation posteriorly. Cardiovascular: Normal rate, regular rhythm. Grossly normal heart sounds.  Good peripheral circulation. Respiratory: Normal respiratory effort.  No retractions. Lungs CTAB. Gastrointestinal: Soft and nontender. Musculoskeletal: Moves upper and lower extremities no difficulty. Neurologic:  Normal speech and language. No gross focal neurologic deficits are appreciated.  Cranial nerves II through XII grossly intact.  No gait instability. Skin:  Skin is warm, dry and intact. No rash noted. Psychiatric: Mood and affect are normal. Speech and behavior are normal.  ____________________________________________   LABS (all labs ordered are listed, but only abnormal results are displayed)   Labs Reviewed  URINALYSIS, COMPLETE (UACMP) WITH MICROSCOPIC - Abnormal; Notable for the following components:      Result Value   pH 8.5 (*)    Bacteria, UA MANY (*)    All other components within normal limits  CBC WITH DIFFERENTIAL/PLATELET - Abnormal; Notable for the following components:   WBC 11.9 (*)    RBC 3.43 (*)    Hemoglobin 10.3 (*)    HCT 31.1 (*)    Neutro Abs 9.1 (*)    Abs Immature Granulocytes 0.11 (*)    All other components within normal limits  COMPREHENSIVE METABOLIC PANEL - Abnormal; Notable for the following components:   Creatinine, Ser 0.33 (*)    Calcium 8.4 (*)    Albumin 2.8 (*)    All other components within normal limits  URINE CULTURE   _______________________________________   PROCEDURES  Procedure(s) performed (including Critical Care):  Procedures   ____________________________________________   INITIAL IMPRESSION / ASSESSMENT AND PLAN / ED COURSE  As part of my medical decision making, I reviewed the following data within the electronic MEDICAL RECORD NUMBER Notes from prior ED visits and [place] Controlled Substance Database  24 year old female presents to the ED with complaint of nausea and headache.  Patient states she has a history of headaches and that this is no different than her normal headache which is frontal.  Patient states in the last 4 days she has vomited twice.  Her OB/GYN had prescribed Phenergan which she no longer has for nausea.  Prior to discharge it was disclosed that she had talked to her OB/GYN who recommended that she come to the ED for fluids to prevent dehydration.  Urinalysis was mildly suggestive for an early UTI.  Patient states that she has taken cephalexin in the past without any difficulty.  Patient was given Tylenol and Reglan while in the ED and states that her headache has improved.  A prescription for continued Phenergan as needed for nausea and a limited amount of Reglan was prescribed.  Patient is aware that she  needs to take the Reglan and Tylenol together if needed for headache.  She is to follow-up with her OB/GYN for any continued problems or concerns.  Also a culture was ordered for her urine as it showed many bacteria with 6-10 WBCs.  ____________________________________________   FINAL CLINICAL IMPRESSION(S) / ED DIAGNOSES  Final diagnoses:  Frontal headache  Nausea  [redacted] weeks gestation of pregnancy  Bacteriuria, asymptomatic in pregnancy     ED Discharge Orders         Ordered    metoCLOPramide (REGLAN) 10 MG tablet  3 times daily with meals     09/04/19 1158    promethazine (PHENERGAN) 25 MG tablet  Every 6 hours PRN     09/04/19 1158    cephALEXin (KEFLEX) 500 MG capsule  3 times daily  09/04/19 1415           Note:  This document was prepared using Dragon voice recognition software and may include unintentional dictation errors.    Johnn Hai, PA-C 09/04/19 1542    Arta Silence, MD 09/05/19 [phone]

## 2019-09-05 LAB — URINE CULTURE: Special Requests: NORMAL

## 2019-11-11 DIAGNOSIS — F411 Generalized anxiety disorder: Secondary | ICD-10-CM | POA: Insufficient documentation

## 2019-11-11 DIAGNOSIS — R519 Headache, unspecified: Secondary | ICD-10-CM | POA: Insufficient documentation

## 2019-11-11 DIAGNOSIS — G43909 Migraine, unspecified, not intractable, without status migrainosus: Secondary | ICD-10-CM | POA: Insufficient documentation

## 2019-11-22 LAB — OB RESULTS CONSOLE RPR: RPR: NONREACTIVE

## 2019-11-22 LAB — OB RESULTS CONSOLE GBS: GBS: NEGATIVE

## 2019-11-22 LAB — OB RESULTS CONSOLE HIV ANTIBODY (ROUTINE TESTING): HIV: NONREACTIVE

## 2019-12-04 ENCOUNTER — Other Ambulatory Visit: Payer: Self-pay

## 2019-12-04 ENCOUNTER — Observation Stay
Admission: EM | Admit: 2019-12-04 | Discharge: 2019-12-04 | Disposition: A | Discharge status: Home / Self Care | Payer: Medicaid Other | Attending: Obstetrics and Gynecology | Admitting: Obstetrics and Gynecology

## 2019-12-04 DIAGNOSIS — O99613 Diseases of the digestive system complicating pregnancy, third trimester: Secondary | ICD-10-CM | POA: Diagnosis not present

## 2019-12-04 DIAGNOSIS — O26893 Other specified pregnancy related conditions, third trimester: Secondary | ICD-10-CM | POA: Diagnosis not present

## 2019-12-04 DIAGNOSIS — M549 Dorsalgia, unspecified: Secondary | ICD-10-CM | POA: Diagnosis present

## 2019-12-04 DIAGNOSIS — O99891 Other specified diseases and conditions complicating pregnancy: Secondary | ICD-10-CM | POA: Diagnosis present

## 2019-12-04 DIAGNOSIS — R197 Diarrhea, unspecified: Secondary | ICD-10-CM | POA: Insufficient documentation

## 2019-12-04 DIAGNOSIS — Z3A38 38 weeks gestation of pregnancy: Secondary | ICD-10-CM | POA: Insufficient documentation

## 2019-12-04 DIAGNOSIS — Z79899 Other long term (current) drug therapy: Secondary | ICD-10-CM | POA: Diagnosis not present

## 2019-12-04 DIAGNOSIS — O212 Late vomiting of pregnancy: Secondary | ICD-10-CM | POA: Insufficient documentation

## 2019-12-04 LAB — CBC
HCT: 30 % — ABNORMAL LOW (ref 36.0–46.0)
Hemoglobin: 9.8 g/dL — ABNORMAL LOW (ref 12.0–15.0)
MCH: 27.9 pg (ref 26.0–34.0)
MCHC: 32.7 g/dL (ref 30.0–36.0)
MCV: 85.5 fL (ref 80.0–100.0)
Platelets: 426 10*3/uL — ABNORMAL HIGH (ref 150–400)
RBC: 3.51 MIL/uL — ABNORMAL LOW (ref 3.87–5.11)
RDW: 13.4 % (ref 11.5–15.5)
WBC: 11.3 10*3/uL — ABNORMAL HIGH (ref 4.0–10.5)
nRBC: 0 % (ref 0.0–0.2)

## 2019-12-04 LAB — WET PREP, GENITAL
Clue Cells Wet Prep HPF POC: NONE SEEN
Sperm: NONE SEEN
Trich, Wet Prep: NONE SEEN
Yeast Wet Prep HPF POC: NONE SEEN

## 2019-12-04 LAB — COMPREHENSIVE METABOLIC PANEL
ALT: 12 U/L (ref 0–44)
AST: 20 U/L (ref 15–41)
Albumin: 2.8 g/dL — ABNORMAL LOW (ref 3.5–5.0)
Alkaline Phosphatase: 171 U/L — ABNORMAL HIGH (ref 38–126)
Anion gap: 11 (ref 5–15)
BUN: 6 mg/dL (ref 6–20)
CO2: 20 mmol/L — ABNORMAL LOW (ref 22–32)
Calcium: 8.5 mg/dL — ABNORMAL LOW (ref 8.9–10.3)
Chloride: 105 mmol/L (ref 98–111)
Creatinine, Ser: 0.43 mg/dL — ABNORMAL LOW (ref 0.44–1.00)
GFR calc Af Amer: >60 mL/min (ref >60)
GFR calc non Af Amer: >60 mL/min (ref >60)
Glucose, Bld: 95 mg/dL (ref 70–99)
Potassium: 3.7 mmol/L (ref 3.5–5.1)
Sodium: 136 mmol/L (ref 135–145)
Total Bilirubin: 0.7 mg/dL (ref 0.3–1.2)
Total Protein: 7 g/dL (ref 6.5–8.1)

## 2019-12-04 LAB — URINALYSIS, COMPLETE (UACMP) WITH MICROSCOPIC
Bilirubin Urine: NEGATIVE
Glucose, UA: NEGATIVE mg/dL
Hgb urine dipstick: NEGATIVE
Ketones, ur: 80 mg/dL — AB
Leukocytes,Ua: NEGATIVE
Nitrite: NEGATIVE
Protein, ur: NEGATIVE mg/dL
Specific Gravity, Urine: 1.014 (ref 1.005–1.030)
pH: 5 (ref 5.0–8.0)

## 2019-12-04 LAB — CHLAMYDIA/NGC RT PCR (ARMC ONLY)
Chlamydia Tr: NOT DETECTED
N gonorrhoeae: NOT DETECTED

## 2019-12-04 MED ORDER — LACTATED RINGERS IV BOLUS: 1000.0000 mL | Freq: Once | INTRAVENOUS | Status: DC

## 2019-12-04 MED ORDER — ONDANSETRON 4 MG PO TBDP
4.0000 mg | ORAL_TABLET | Freq: Three times a day (TID) | ORAL | 0 refills | Status: DC | PRN
Start: 2019-12-04 — End: 2019-12-19

## 2019-12-04 MED ORDER — PROMETHAZINE HCL 25 MG/ML IJ SOLN
25.0000 mg | Freq: Once | INTRAMUSCULAR | Status: AC
  Administered 2019-12-04: 25 mg via INTRAMUSCULAR
  Filled 2019-12-04: qty 1

## 2019-12-04 MED ORDER — ACETAMINOPHEN 325 MG PO TABS
ORAL_TABLET | ORAL | Status: AC
  Administered 2019-12-04: 650 mg
  Filled 2019-12-04: qty 2

## 2019-12-04 NOTE — OB Triage Note (Signed)
Patient presented to L&D for evaluation with complaints of headache and contractions last night around 2100 since she has developed nausea/ vomiting and diarrhea around 0400 this morning.  One episode of vomiting since this morning, 5 episodes of diarrhea.  Reports normal fetal movement, has noticed some vaginal discharge/ bleeding 2 days ago which has progressed from a light pink discharge to bright red like a period when she wiped, says she hasn't needed to wear a pad.  Denies intercourse within the last 24 hours.

## 2019-12-04 NOTE — Discharge Summary (Signed)
Ellen Hart is a 24 y.o. female. She is at [redacted]w[redacted]d gestation. Patient's last menstrual period was 03/10/2019 (approximate). Estimated Date of Delivery: 12/18/19  Prenatal care site: [facility]   Current pregnancy complicated by:   Chief complaint:Nausea, diarrhea, cramping and contractions- worse in her back. Started yesterday evening around 9pm, and worsened overnight. This afternoon has not been able to tolerate PO intake and has vomited several times. Had decreased appetite yesterday    S: Resting comfortably. no VB.no LOF,  Active fetal movement. Denies: HA, visual changes, SOB, or RUQ/epigastric pain  Maternal Medical History:   Past Medical History:  Diagnosis Date  . Migraine   . Ovarian cyst     Past Surgical History:  Procedure Laterality Date  . APPENDECTOMY    . WISDOM TOOTH EXTRACTION      Allergies  Allergen Reactions  . Amoxicillin Anaphylaxis    Prior to Admission medications   Medication Sig Start Date End Date Taking? Authorizing Provider  ferrous sulfate 325 (65 FE) MG tablet Take 325 mg by mouth daily with breakfast.   Yes [provider]  Prenatal Vit-Fe Fumarate-FA (MULTIVITAMIN-PRENATAL) 27-0.8 MG TABS tablet Take 1 tablet by mouth daily at 12 noon.   Yes [provider]  cephALEXin (KEFLEX) 500 MG capsule Take 1 capsule (500 mg total) by mouth 3 (three) times daily. Patient not taking: Reported on 12/04/2019 09/04/19   Tommi Rumps, PA-C  metoCLOPramide (REGLAN) 10 MG tablet Take 1 tablet (10 mg total) by mouth 3 (three) times daily with meals. 09/04/19 09/03/20  Tommi Rumps, PA-C  ondansetron (ZOFRAN-ODT) 4 MG disintegrating tablet Take 1 tablet (4 mg total) by mouth every 8 (eight) hours as needed for nausea or vomiting. 12/04/19   Ranyia Witting, Prudencio Pair, CNM  promethazine (PHENERGAN) 25 MG tablet Take 1 tablet (25 mg total) by mouth every 6 (six) hours as needed. 09/04/19   Tommi Rumps, PA-C      Social History:  She  reports that she has never smoked. She has never used smokeless tobacco. She reports current alcohol use. She reports previous drug use.  Family History: no history of gyn cancers  Review of Systems: A full review of systems was performed and negative except as noted in the HPI.     O:  BP 128/69 (BP Location: Right Arm)   Temp 98.3 F (36.8 C) (Oral)   Resp 17   Ht 5\' 3"  (1.6 m)   Wt (!) 100.2 kg   LMP 03/10/2019 (Approximate)   BMI 39.15 kg/m  Results for orders placed or performed during the hospital encounter of 12/04/19 (from the past 48 hour(s))  Comprehensive metabolic panel   Collection Time: 12/04/19  2:26 PM  Result Value Ref Range   Sodium 136 135 - 145 mmol/L   Potassium 3.7 3.5 - 5.1 mmol/L   Chloride 105 98 - 111 mmol/L   CO2 20 (L) 22 - 32 mmol/L   Glucose, Bld 95 70 - 99 mg/dL   BUN 6 6 - 20 mg/dL   Creatinine, Ser 12/06/19 (L) 0.44 - 1.00 mg/dL   Calcium 8.5 (L) 8.9 - 10.3 mg/dL   Total Protein 7.0 6.5 - 8.1 g/dL   Albumin 2.8 (L) 3.5 - 5.0 g/dL   AST 20 15 - 41 U/L   ALT 12 0 - 44 U/L   Alkaline Phosphatase 171 (H) 38 - 126 U/L   Total Bilirubin 0.7 0.3 - 1.2 mg/dL   GFR calc non  Af Amer >60 >60 mL/min   GFR calc Af Amer >60 >60 mL/min   Anion gap 11 5 - 15  CBC on admission   Collection Time: 12/04/19  2:26 PM  Result Value Ref Range   WBC 11.3 (H) 4.0 - 10.5 K/uL   RBC 3.51 (L) 3.87 - 5.11 MIL/uL   Hemoglobin 9.8 (L) 12.0 - 15.0 g/dL   HCT 87.5 (L) 36 - 46 %   MCV 85.5 80.0 - 100.0 fL   MCH 27.9 26.0 - 34.0 pg   MCHC 32.7 30.0 - 36.0 g/dL   RDW 64.3 32.9 - 51.8 %   Platelets 426 (H) 150 - 400 K/uL   nRBC 0.0 0.0 - 0.2 %  Urinalysis, Complete w Microscopic   Collection Time: 12/04/19  2:59 PM  Result Value Ref Range   Color, Urine YELLOW (A) YELLOW   APPearance HAZY (A) CLEAR   Specific Gravity, Urine 1.014 1.005 - 1.030   pH 5.0 5.0 - 8.0   Glucose, UA NEGATIVE NEGATIVE mg/dL   Hgb urine dipstick NEGATIVE NEGATIVE   Bilirubin Urine  NEGATIVE NEGATIVE   Ketones, ur 80 (A) NEGATIVE mg/dL   Protein, ur NEGATIVE NEGATIVE mg/dL   Nitrite NEGATIVE NEGATIVE   Leukocytes,Ua NEGATIVE NEGATIVE   RBC / HPF 0-5 0 - 5 RBC/hpf   WBC, UA 6-10 0 - 5 WBC/hpf   Bacteria, UA RARE (A) NONE SEEN   Squamous Epithelial / LPF 6-10 0 - 5  Wet prep, genital   Collection Time: 12/04/19  3:58 PM   Specimen: Vaginal; Genital  Result Value Ref Range   Yeast Wet Prep HPF POC NONE SEEN NONE SEEN   Trich, Wet Prep NONE SEEN NONE SEEN   Clue Cells Wet Prep HPF POC NONE SEEN NONE SEEN   WBC, Wet Prep HPF POC MODERATE (A) NONE SEEN   Sperm NONE SEEN      Constitutional: NAD, AAOx3  HE/ENT: extraocular movements grossly intact, moist mucous membranes CV: RRR PULM: nl respiratory effort, CTABL     Abd: gravid, non-tender, non-distended, soft      Ext: Non-tender, Nonedematous   Psych: mood appropriate, speech normal Pelvic: SSE done- moderate amt adherent cloudy yellow/white DC noted, no odor. Mild erythema noted.  SVE Closed/50%/midposition-soft   Fetal  monitoring: Cat I Appropriate for GA Baseline:  Variability: moderate Accelerations: absent/ present x >2 Decelerations absent TOCO: occasional UCs    A/P: 24 y.o. [redacted]w[redacted]d here for antenatal surveillance for GI upset with nausea/vomiting and diarrhea, back pain.   Principle Diagnosis: 38wks, N/V/D and back pain in pregnancy    labor: not present.   Fetal Wellbeing: Reassuring Cat 1 tracing with Reactive NST   [name] given, which helped with symptoms. Rx Zofran to pharmacy. Reviewed clear liquids, advance as tolerated to [employer]   Pt able to tolerate PO intake with vomiting after [name].   D/c home stable, precautions reviewed, follow-up as scheduled.    Randa Ngo, CNM 12/04/2019  6:11 PM

## 2019-12-18 ENCOUNTER — Inpatient Hospital Stay
Admission: EM | Admit: 2019-12-18 | Discharge: 2019-12-19 | DRG: 806 | Disposition: A | Discharge status: Home / Self Care | Payer: Medicaid Other | Attending: Obstetrics and Gynecology | Admitting: Obstetrics and Gynecology

## 2019-12-18 ENCOUNTER — Inpatient Hospital Stay: Payer: Medicaid Other | Admitting: Anesthesiology

## 2019-12-18 ENCOUNTER — Other Ambulatory Visit: Payer: Self-pay

## 2019-12-18 ENCOUNTER — Inpatient Hospital Stay: Admit: 2019-12-18 | Payer: Self-pay

## 2019-12-18 ENCOUNTER — Encounter: Payer: Self-pay | Admitting: Obstetrics and Gynecology

## 2019-12-18 DIAGNOSIS — Z3490 Encounter for supervision of normal pregnancy, unspecified, unspecified trimester: Secondary | ICD-10-CM

## 2019-12-18 DIAGNOSIS — E669 Obesity, unspecified: Secondary | ICD-10-CM | POA: Diagnosis present

## 2019-12-18 DIAGNOSIS — D62 Acute posthemorrhagic anemia: Secondary | ICD-10-CM | POA: Diagnosis not present

## 2019-12-18 DIAGNOSIS — O9081 Anemia of the puerperium: Secondary | ICD-10-CM | POA: Diagnosis not present

## 2019-12-18 DIAGNOSIS — O99214 Obesity complicating childbirth: Secondary | ICD-10-CM | POA: Diagnosis present

## 2019-12-18 DIAGNOSIS — O4292 Full-term premature rupture of membranes, unspecified as to length of time between rupture and onset of labor: Secondary | ICD-10-CM | POA: Diagnosis present

## 2019-12-18 DIAGNOSIS — Z20822 Contact with and (suspected) exposure to covid-19: Secondary | ICD-10-CM | POA: Diagnosis present

## 2019-12-18 DIAGNOSIS — O4290 Premature rupture of membranes, unspecified as to length of time between rupture and onset of labor, unspecified weeks of gestation: Secondary | ICD-10-CM | POA: Diagnosis present

## 2019-12-18 DIAGNOSIS — Z3A40 40 weeks gestation of pregnancy: Secondary | ICD-10-CM | POA: Diagnosis not present

## 2019-12-18 LAB — CBC
HCT: 29.6 % — ABNORMAL LOW (ref 36.0–46.0)
Hemoglobin: 9.5 g/dL — ABNORMAL LOW (ref 12.0–15.0)
MCH: 26.9 pg (ref 26.0–34.0)
MCHC: 32.1 g/dL (ref 30.0–36.0)
MCV: 83.9 fL (ref 80.0–100.0)
Platelets: 489 10*3/uL — ABNORMAL HIGH (ref 150–400)
RBC: 3.53 MIL/uL — ABNORMAL LOW (ref 3.87–5.11)
RDW: 13.4 % (ref 11.5–15.5)
WBC: 13.7 10*3/uL — ABNORMAL HIGH (ref 4.0–10.5)
nRBC: 0 % (ref 0.0–0.2)

## 2019-12-18 LAB — SARS CORONAVIRUS 2 BY RT PCR (HOSPITAL ORDER, PERFORMED IN ~~LOC~~ HOSPITAL LAB): SARS Coronavirus 2: NEGATIVE

## 2019-12-18 LAB — ABO/RH: ABO/RH(D): O POS

## 2019-12-18 LAB — TYPE AND SCREEN
ABO/RH(D): O POS
Antibody Screen: NEGATIVE

## 2019-12-18 LAB — RUPTURE OF MEMBRANE (ROM)PLUS: Rom Plus: POSITIVE

## 2019-12-18 MED ORDER — EPHEDRINE 5 MG/ML INJ: 10.0000 mg | INTRAVENOUS | Status: DC | PRN

## 2019-12-18 MED ORDER — FERROUS SULFATE 325 (65 FE) MG PO TABS
325.0000 mg | ORAL_TABLET | Freq: Two times a day (BID) | ORAL | Status: DC
  Administered 2019-12-19: 325 mg via ORAL
  Filled 2019-12-18: qty 1

## 2019-12-18 MED ORDER — PHENYLEPHRINE 40 MCG/ML (10ML) SYRINGE FOR IV PUSH (FOR BLOOD PRESSURE SUPPORT): 80.0000 ug | PREFILLED_SYRINGE | INTRAVENOUS | Status: DC | PRN

## 2019-12-18 MED ORDER — DIBUCAINE (PERIANAL) 1 % EX OINT: 1.0000 "application " | TOPICAL_OINTMENT | CUTANEOUS | Status: DC | PRN

## 2019-12-18 MED ORDER — MISOPROSTOL 200 MCG PO TABS
ORAL_TABLET | ORAL | Status: AC
  Filled 2019-12-18: qty 4

## 2019-12-18 MED ORDER — BENZOCAINE-MENTHOL 20-0.5 % EX AERO
1.0000 "application " | INHALATION_SPRAY | CUTANEOUS | Status: DC | PRN
  Administered 2019-12-19: 1 via TOPICAL
  Filled 2019-12-18: qty 56

## 2019-12-18 MED ORDER — OXYCODONE HCL 5 MG PO TABS: 5.0000 mg | ORAL_TABLET | ORAL | Status: DC | PRN

## 2019-12-18 MED ORDER — LACTATED RINGERS IV SOLN: 500.0000 mL | INTRAVENOUS | Status: DC | PRN

## 2019-12-18 MED ORDER — TERBUTALINE SULFATE 1 MG/ML IJ SOLN: 0.2500 mg | Freq: Once | INTRAMUSCULAR | Status: DC | PRN

## 2019-12-18 MED ORDER — SIMETHICONE 80 MG PO CHEW: 80.0000 mg | CHEWABLE_TABLET | ORAL | Status: DC | PRN

## 2019-12-18 MED ORDER — IBUPROFEN 600 MG PO TABS
600.0000 mg | ORAL_TABLET | Freq: Four times a day (QID) | ORAL | Status: DC
  Administered 2019-12-18 – 2019-12-19 (×4): 600 mg via ORAL
  Filled 2019-12-18 (×4): qty 1

## 2019-12-18 MED ORDER — DOCUSATE SODIUM 100 MG PO CAPS
100.0000 mg | ORAL_CAPSULE | Freq: Two times a day (BID) | ORAL | Status: DC
  Administered 2019-12-18 – 2019-12-19 (×2): 100 mg via ORAL
  Filled 2019-12-18 (×2): qty 1

## 2019-12-18 MED ORDER — COCONUT OIL OIL
1.0000 "application " | TOPICAL_OIL | Status: DC | PRN
  Administered 2019-12-19: 1 via TOPICAL
  Filled 2019-12-18: qty 120

## 2019-12-18 MED ORDER — LACTATED RINGERS IV SOLN
500.0000 mL | Freq: Once | INTRAVENOUS | Status: AC
  Administered 2019-12-18: 500 mL via INTRAVENOUS

## 2019-12-18 MED ORDER — OXYTOCIN-SODIUM CHLORIDE 30-0.9 UT/500ML-% IV SOLN
1.0000 m[IU]/min | INTRAVENOUS | Status: DC
  Administered 2019-12-18: 2 m[IU]/min via INTRAVENOUS

## 2019-12-18 MED ORDER — ONDANSETRON HCL 4 MG/2ML IJ SOLN: 4.0000 mg | INTRAMUSCULAR | Status: DC | PRN

## 2019-12-18 MED ORDER — LIDOCAINE HCL (PF) 1 % IJ SOLN: 30.0000 mL | INTRAMUSCULAR | Status: DC | PRN

## 2019-12-18 MED ORDER — LIDOCAINE-EPINEPHRINE (PF) 1.5 %-1:200000 IJ SOLN
INTRAMUSCULAR | Status: AC | PRN
  Administered 2019-12-18: 3 mL via EPIDURAL

## 2019-12-18 MED ORDER — AMMONIA AROMATIC IN INHA
RESPIRATORY_TRACT | Status: AC
  Filled 2019-12-18: qty 10

## 2019-12-18 MED ORDER — ACETAMINOPHEN 325 MG PO TABS
650.0000 mg | ORAL_TABLET | ORAL | Status: DC | PRN
  Administered 2019-12-19 (×2): 650 mg via ORAL
  Filled 2019-12-18 (×2): qty 2

## 2019-12-18 MED ORDER — LACTATED RINGERS IV SOLN
INTRAVENOUS | Status: DC
  Administered 2019-12-18: 1000 mL via INTRAVENOUS

## 2019-12-18 MED ORDER — PRENATAL MULTIVITAMIN CH
1.0000 | ORAL_TABLET | Freq: Every day | ORAL | Status: DC
  Administered 2019-12-19: 1 via ORAL
  Filled 2019-12-18: qty 1

## 2019-12-18 MED ORDER — LIDOCAINE HCL (PF) 1 % IJ SOLN
INTRAMUSCULAR | Status: AC | PRN
  Administered 2019-12-18: 1 mL via SUBCUTANEOUS

## 2019-12-18 MED ORDER — LIDOCAINE HCL (PF) 1 % IJ SOLN
INTRAMUSCULAR | Status: AC
  Filled 2019-12-18: qty 30

## 2019-12-18 MED ORDER — ACETAMINOPHEN 325 MG PO TABS: 650.0000 mg | ORAL_TABLET | ORAL | Status: DC | PRN

## 2019-12-18 MED ORDER — OXYTOCIN 10 UNIT/ML IJ SOLN
INTRAMUSCULAR | Status: AC
  Filled 2019-12-18: qty 2

## 2019-12-18 MED ORDER — FENTANYL 2.5 MCG/ML W/ROPIVACAINE 0.15% IN NS 100 ML EPIDURAL (ARMC)
EPIDURAL | Status: AC
  Filled 2019-12-18: qty 100

## 2019-12-18 MED ORDER — OXYTOCIN BOLUS FROM INFUSION: 333.0000 mL | Freq: Once | INTRAVENOUS | Status: DC

## 2019-12-18 MED ORDER — OXYCODONE-ACETAMINOPHEN 5-325 MG PO TABS: 1.0000 | ORAL_TABLET | ORAL | Status: DC | PRN

## 2019-12-18 MED ORDER — DIPHENHYDRAMINE HCL 50 MG/ML IJ SOLN: 12.5000 mg | INTRAMUSCULAR | Status: DC | PRN

## 2019-12-18 MED ORDER — FENTANYL 2.5 MCG/ML W/ROPIVACAINE 0.15% IN NS 100 ML EPIDURAL (ARMC)
12.0000 mL/h | EPIDURAL | Status: DC
  Administered 2019-12-18: 12 mL/h via EPIDURAL

## 2019-12-18 MED ORDER — SOD CITRATE-CITRIC ACID 500-334 MG/5ML PO SOLN: 30.0000 mL | ORAL | Status: DC | PRN

## 2019-12-18 MED ORDER — SODIUM CHLORIDE 0.9 % IV SOLN
INTRAVENOUS | Status: AC | PRN
  Administered 2019-12-18 (×2): 5 mL via EPIDURAL

## 2019-12-18 MED ORDER — WITCH HAZEL-GLYCERIN EX PADS: 1.0000 "application " | MEDICATED_PAD | CUTANEOUS | Status: DC | PRN

## 2019-12-18 MED ORDER — FENTANYL CITRATE (PF) 100 MCG/2ML IJ SOLN
50.0000 ug | INTRAMUSCULAR | Status: DC | PRN
  Administered 2019-12-18: 50 ug via INTRAVENOUS
  Filled 2019-12-18: qty 2

## 2019-12-18 MED ORDER — OXYCODONE-ACETAMINOPHEN 5-325 MG PO TABS: 2.0000 | ORAL_TABLET | ORAL | Status: DC | PRN

## 2019-12-18 MED ORDER — DIPHENHYDRAMINE HCL 25 MG PO CAPS: 25.0000 mg | ORAL_CAPSULE | Freq: Four times a day (QID) | ORAL | Status: DC | PRN

## 2019-12-18 MED ORDER — TETANUS-DIPHTH-ACELL PERTUSSIS 5-2.5-18.5 LF-MCG/0.5 IM SUSP
0.5000 mL | Freq: Once | INTRAMUSCULAR | Status: DC
  Filled 2019-12-18: qty 0.5

## 2019-12-18 MED ORDER — ONDANSETRON HCL 4 MG PO TABS
4.0000 mg | ORAL_TABLET | ORAL | Status: DC | PRN
  Filled 2019-12-18: qty 1

## 2019-12-18 MED ORDER — OXYTOCIN-SODIUM CHLORIDE 30-0.9 UT/500ML-% IV SOLN
2.5000 [IU]/h | INTRAVENOUS | Status: DC
  Filled 2019-12-18: qty 1000

## 2019-12-18 MED ORDER — ONDANSETRON HCL 4 MG/2ML IJ SOLN: 4.0000 mg | Freq: Four times a day (QID) | INTRAMUSCULAR | Status: DC | PRN

## 2019-12-18 MED ORDER — OXYCODONE HCL 5 MG PO TABS: 10.0000 mg | ORAL_TABLET | ORAL | Status: DC | PRN

## 2019-12-18 NOTE — Progress Notes (Signed)
Labor Progress Note  Ellen Hart is a 24 y.o. G3P0020 at [redacted]w[redacted]d by ultrasound admitted for PROM  Subjective: Nurse reports pt is complete, but not feeling any pressure or desire to push  Objective: BP 131/79   Pulse 97   Temp 98 F (36.7 C) (Oral)   Resp 18   Ht 5\' 3"  (1.6 m)   Wt 98.4 kg   LMP 03/10/2019 (Approximate)   SpO2 95%   BMI 38.44 kg/m   Fetal Assessment: FHT:  FHR: 125 bpm, variability: moderate,  accelerations:  Present,  decelerations:  Absent Category/reactivity:  Category I UC:   regular, every 3-4 minutes SVE:   10/100/0 Membrane status: PROM'd at 2300, ROM forebag at 0840 Amniotic color: Clear   Assessment / Plan: Augmentation of labor, progressing well, will labor down  Labor: Progressing normally Preeclampsia:  131/79 Fetal Wellbeing:  Category I Pain Control:  Epidural I/D:  GBS neg, Afebrile Anticipated MOD:  NSVD  03/12/2019, CNM 12/18/2019, 3:05 PM

## 2019-12-18 NOTE — Progress Notes (Signed)
Labor Progress Note  Ellen Hart is a 24 y.o. G3P0020 at [redacted]w[redacted]d by ultrasound admitted for PROM  Subjective: Comfortable with epidural, she is sleeping  Objective: BP 129/74 (BP Location: Right Arm)   Pulse 76   Temp 98 F (36.7 C) (Oral)   Resp 16   Ht 5\' 3"  (1.6 m)   Wt 98.4 kg   LMP 03/10/2019 (Approximate)   BMI 38.44 kg/m   Fetal Assessment: FHT:  FHR: 130 bpm, variability: moderate,  accelerations:  Present,  decelerations:  Absent Category/reactivity:  Category I UC:   regular, every 3-5 minutes SVE:   deferred Membrane status: PROM'd at 2300, ROM forebag at 0840 Amniotic color: Clear  Labs: Lab Results  Component Value Date   WBC 13.7 (H) 12/18/2019   HGB 9.5 (L) 12/18/2019   HCT 29.6 (L) 12/18/2019   MCV 83.9 12/18/2019   PLT 489 (H) 12/18/2019    Assessment / Plan: Augmentation of labor, progressing well  Labor: Progressing normally Preeclampsia:  129/74 Fetal Wellbeing:  Category I Pain Control:  Epidural I/D:  GBS neg, Afebrile Anticipated MOD:  NSVD  02/17/2020, CNM 12/18/2019, 12:25 PM

## 2019-12-18 NOTE — Anesthesia Preprocedure Evaluation (Signed)
Anesthesia Evaluation  Patient identified by MRN, date of birth, ID band Patient awake    Reviewed: Allergy & Precautions, H&P , NPO status , Patient's Chart, lab work & pertinent test results  History of Anesthesia Complications Negative for: history of anesthetic complications  Airway Mallampati: III  TM Distance: >3 FB Neck ROM: full    Dental  (+) Chipped   Pulmonary neg pulmonary ROS, neg shortness of breath,    Pulmonary exam normal        Cardiovascular Exercise Tolerance: Good (-) hypertension(-) Past MI negative cardio ROS Normal cardiovascular exam     Neuro/Psych  Headaches,    GI/Hepatic negative GI ROS,   Endo/Other    Renal/GU   negative genitourinary   Musculoskeletal   Abdominal   Peds  Hematology negative hematology ROS (+)   Anesthesia Other Findings Past Medical History: No date: Migraine No date: Ovarian cyst  Past Surgical History: No date: APPENDECTOMY No date: WISDOM TOOTH EXTRACTION  BMI    Body Mass Index: 38.44 kg/m      Reproductive/Obstetrics (+) Pregnancy                             Anesthesia Physical Anesthesia Plan  ASA: II  Anesthesia Plan: Epidural   Post-op Pain Management:    Induction:   PONV Risk Score and Plan:   Airway Management Planned: Natural Airway  Additional Equipment:   Intra-op Plan:   Post-operative Plan:   Informed Consent: I have reviewed the patients History and Physical, chart, labs and discussed the procedure including the risks, benefits and alternatives for the proposed anesthesia with the patient or authorized representative who has indicated his/her understanding and acceptance.     Dental Advisory Given  Plan Discussed with: Anesthesiologist  Anesthesia Plan Comments: (Patient reports no bleeding problems and no anticoagulant use.   Patient consented for risks of anesthesia including but not  limited to:  - adverse reactions to medications - risk of bleeding, infection and or nerve damage from epidural that could lead to paralysis - risk of headache or failed epidural - nerve damage due to positioning - Damage to heart, brain, lungs, other parts of body or loss of life  Patient voiced understanding.)        Anesthesia Quick Evaluation

## 2019-12-18 NOTE — Progress Notes (Signed)
Labor Progress Note  Ellen Hart is a 24 y.o. G3P0020 at [redacted]w[redacted]d by ultrasound admitted for PROM  Subjective: Reports increasing UCs.  Wanted to discuss pain management options  Objective: BP 129/74 (BP Location: Right Arm)   Pulse 76   Temp 97.8 F (36.6 C) (Oral)   Resp 16   Ht 5\' 3"  (1.6 m)   Wt 98.4 kg   LMP 03/10/2019 (Approximate)   BMI 38.44 kg/m   Fetal Assessment: FHT:  FHR: 140 bpm, variability: moderate,  accelerations:  Abscent,  decelerations:  Absent Category/reactivity:  Category I UC:   irregular SVE:    Dilation: 3cm  Effacement: 90%  Station:  -2  Consistency: soft  Position: anterior  Membrane status: PROM'd at 2300, ROM forebag at 0840 Amniotic color: Clear  Labs: Lab Results  Component Value Date   WBC 13.7 (H) 12/18/2019   HGB 9.5 (L) 12/18/2019   HCT 29.6 (L) 12/18/2019   MCV 83.9 12/18/2019   PLT 489 (H) 12/18/2019    Assessment / Plan: Progressing on her own but slowly, will add low dose Pitocin to accelerate labor progress.  Labor: Will start pitocin Preeclampsia:  129/74 Fetal Wellbeing:  Category I Pain Control:  Labor support without medications, interested in IV pain medication and possibly epidural I/D:  GBS neg, Afebrile Anticipated MOD:  NSVD  02/17/2020, CNM 12/18/2019, 8:27 AM

## 2019-12-18 NOTE — Discharge Summary (Signed)
Obstetrical Discharge Summary  Patient Name: Ellen Hart DOB: Dec 14, 1995 MRN: [id number]  Date of Admission: 12/18/2019 Date of Delivery: 12/18/19 Delivered by: Haroldine Laws Date of Discharge: 12/19/2019  Primary OB: [facility] HAF:[id number] last menstrual period was 03/10/2019 (approximate). EDC Estimated Date of Delivery: 12/18/19 Gestational Age at Delivery: [redacted]w[redacted]d   Antepartum complications:  1. Obesity 2. Anemia Admitting Diagnosis: PROM Secondary Diagnosis: Patient Active Problem List   Diagnosis Date Noted  . Acute blood loss anemia 12/19/2019  . NSVD (normal spontaneous vaginal delivery) 12/18/2019    Augmentation: AROM and Pitocin Complications: None  Intrapartum complications/course:  Delivery Type: spontaneous vaginal delivery Anesthesia: epidural Placenta: spontaneous Laceration: 2nd degree vaginal/perineal Episiotomy: none Newborn Data: Live born female  Birth Weight:   APGAR: 8, 9 "Aubree" Newborn Delivery   Birth date/time: 12/18/2019 16:58:00 Delivery type: Vaginal, Spontaneous      24yo G4P1021 at 39+4wks presenting with PROM, SROM with clear fluid, later a fore bag was rupture.  She progressed to complete and pushed over an intact perineum and delivered the fetal head, followed promptly by the shoulders. She was in control the whole time, and the baby placed on the maternal abdomen. Delayed cord clamping and the FOB cut his cord, while he was skin to skin. The placenta delivered spontaneously and intact. 2nd degree laceration that was repaired. Mom and baby tolerated the procedure well.   Postpartum Procedures: none  Post partum course:  Patient had an uncomplicated postpartum course.  By time of discharge on PPD#1, her pain was controlled on oral pain medications; she had appropriate lochia and was ambulating, voiding without difficulty and tolerating regular diet.  She was deemed stable for discharge to home.       Discharge Physical  Exam:  BP 115/70 (BP Location: Right Arm)   Pulse 78   Temp 98 F (36.7 C) (Oral)   Resp 20   Ht 5\' 3"  (1.6 m)   Wt 98.4 kg   LMP 03/10/2019 (Approximate)   SpO2 97%   BMI 38.44 kg/m   General: alert and no distress Pulm: normal respiratory effort Lochia: appropriate Abdomen: soft, NT Uterine Fundus: firm, below umbilicus Extremities: No evidence of DVT seen on physical exam. No lower extremity edema. Edinburgh:  Edinburgh Postnatal Depression Scale Screening Tool 12/19/2019 12/18/2019  I have been able to laugh and see the funny side of things. 0 (No Data)  I have looked forward with enjoyment to things. 0 -  I have blamed myself unnecessarily when things went wrong. 0 -  I have been anxious or worried for no good reason. 0 -  I have felt scared or panicky for no good reason. 1 -  Things have been getting on top of me. 1 -  I have been so unhappy that I have had difficulty sleeping. 0 -  I have felt sad or miserable. 0 -  I have been so unhappy that I have been crying. 0 -  The thought of harming myself has occurred to me. 0 -  Edinburgh Postnatal Depression Scale Total 2 -     Labs: CBC Latest Ref Rng & Units 12/19/2019 12/18/2019 12/04/2019  WBC 4.0 - 10.5 K/uL 14.2(H) 13.7(H) 11.3(H)  Hemoglobin 12.0 - 15.0 g/dL 8.1(L) 9.5(L) 9.8(L)  Hematocrit 36 - 46 % 24.6(L) 29.6(L) 30.0(L)  Platelets 150 - 400 K/uL 385 489(H) 426(H)   O POS Performed at [facility], [address] [place], [place]  Hemoglobin  Date Value Ref Range  Status  12/19/2019 8.1 (L) 12.0 - 15.0 g/dL Final   HCT  Date Value Ref Range Status  12/19/2019 24.6 (L) 36 - 46 % Final    Disposition: stable, discharge to home Baby Feeding: breastmilk Baby Disposition: home with mom  Contraception: TBD  Prenatal Labs:  Blood type/Rh O pos  Antibody screen neg  Rubella Immune  Varicella Immune  RPR NR  HBsAg Neg  HIV NR  GC neg  Chlamydia neg  Genetic screening negative  1 hour  GTT 123  3 hour GTT   GBS Neg    Rh Immune globulin given: n/a Rubella vaccine given: n/a Varicella vaccine given: n/a Tdap vaccine given in AP or PP setting: AP Flu vaccine given in AP or PP setting: n/a  Plan: Belinda Schlichting was discharged to home in good condition. Follow-up appointment with delivering provider in 6 weeks.  Discharge Instructions: Per After Visit Summary. Activity: Advance as tolerated. Pelvic rest for 6 weeks.   Diet: Regular Discharge Medications: Allergies as of 12/19/2019      Reactions   Amoxicillin Anaphylaxis      Medication List    STOP taking these medications   ondansetron 4 MG disintegrating tablet Commonly known as: ZOFRAN-ODT   promethazine 25 MG tablet Commonly known as: PHENERGAN     TAKE these medications   ferrous sulfate 325 (65 FE) MG tablet Take 1 tablet (325 mg total) by mouth 2 (two) times daily with a meal. What changed: when to take this   multivitamin-prenatal 27-0.8 MG Tabs tablet Take 1 tablet by mouth daily at 12 noon.      Outpatient follow up:   Follow-up Information    Haroldine Laws, CNM. Schedule an appointment as soon as possible for a visit in 6 week(s).   Specialty: Certified Nurse Midwife Contact information: [address] [place] [address] [phone]               Signed: Quillian Quince 12/19/2019 5:32 PM

## 2019-12-18 NOTE — H&P (Signed)
OB History & Physical   History of Present Illness:  Chief Complaint:   HPI:  Ellen Hart is a 24 y.o. G5P0020 female at [redacted]w[redacted]d dated by 5 week u/s.  She presents to L&D for PROM.  She reports:  -active fetal movement -no leakage of fluid -no vaginal bleeding -no contractions  Pregnancy Issues: 1. Obesity 2. Anemia   Maternal Medical History:   Past Medical History:  Diagnosis Date  . Migraine   . Ovarian cyst     Past Surgical History:  Procedure Laterality Date  . APPENDECTOMY    . WISDOM TOOTH EXTRACTION      Allergies  Allergen Reactions  . Amoxicillin Anaphylaxis    Prior to Admission medications   Medication Sig Start Date End Date Taking? Authorizing Provider  ferrous sulfate 325 (65 FE) MG tablet Take 325 mg by mouth daily with breakfast.   Yes [provider]  ondansetron (ZOFRAN-ODT) 4 MG disintegrating tablet Take 1 tablet (4 mg total) by mouth every 8 (eight) hours as needed for nausea or vomiting. 12/04/19  Yes McVey, Prudencio Pair, CNM  Prenatal Vit-Fe Fumarate-FA (MULTIVITAMIN-PRENATAL) 27-0.8 MG TABS tablet Take 1 tablet by mouth daily at 12 noon.   Yes [provider]  promethazine (PHENERGAN) 25 MG tablet Take 1 tablet (25 mg total) by mouth every 6 (six) hours as needed. Patient not taking: Reported on 12/18/2019 09/04/19   Tommi Rumps, PA-C     Prenatal care site: [facility]  Social History: She  reports that she has never smoked. She has never used smokeless tobacco. She reports current alcohol use. She reports previous drug use.  Family History: family history is not on file.   Review of Systems: A full review of systems was performed and negative except as noted in the HPI.    Physical Exam:  Vital Signs: BP 129/74 (BP Location: Right Arm)   Pulse 76   Temp 97.8 F (36.6 C) (Oral)   Resp 16   Ht 5\' 3"  (1.6 m)   Wt 98.4 kg   LMP 03/10/2019 (Approximate)   BMI 38.44 kg/m   General:   alert and  cooperative  Skin:  normal  Neurologic:    Alert & oriented x 3  Lungs:   nl effort  Heart:   regular rate and rhythm  Abdomen:  soft, non-tender; bowel sounds normal; no masses,  no organomegaly  Extremities: : non-tender, symmetric     Results for orders placed or performed during the hospital encounter of 12/18/19 (from the past 24 hour(s))  ROM Plus ([facility] only)     Status: None   Collection Time: 12/18/19  4:56 AM  Result Value Ref Range   Rom Plus POSITIVE   SARS Coronavirus 2 by RT PCR (hospital order, performed in [facility] hospital lab) Nasopharyngeal Nasopharyngeal Swab     Status: None   Collection Time: 12/18/19  5:37 AM   Specimen: Nasopharyngeal Swab  Result Value Ref Range   SARS Coronavirus 2 NEGATIVE NEGATIVE  CBC     Status: Abnormal   Collection Time: 12/18/19  7:02 AM  Result Value Ref Range   WBC 13.7 (H) 4.0 - 10.5 K/uL   RBC 3.53 (L) 3.87 - 5.11 MIL/uL   Hemoglobin 9.5 (L) 12.0 - 15.0 g/dL   HCT 02/17/20 (L) 36 - 46 %   MCV 83.9 80.0 - 100.0 fL   MCH 26.9 26.0 - 34.0 pg   MCHC 32.1 30.0 - 36.0 g/dL  RDW 13.4 11.5 - 15.5 %   Platelets 489 (H) 150 - 400 K/uL   nRBC 0.0 0.0 - 0.2 %  Type and screen [facility]     Status: None   Collection Time: 12/18/19  7:03 AM  Result Value Ref Range   ABO/RH(D) O POS    Antibody Screen NEG    Sample Expiration      12/21/2019,2359 Performed at [facility], [address] [place], [place] [address]     Pertinent Results:  Prenatal Labs: Blood type/Rh O pos  Antibody screen neg  Rubella Immune  Varicella Immune  RPR NR  HBsAg Neg  HIV NR  GC neg  Chlamydia neg  Genetic screening negative  1 hour GTT 123  3 hour GTT   GBS Neg   FHT: FHR: 135 bpm, variability: moderate,  accelerations:  Present,  decelerations:  Absent Category/reactivity:  Category I TOCO: regular, every 6 minutes SVE: Dilation: 1.5 / Effacement (%): 50 / Station: -1    Cephalic by  SVE    Assessment:  Ellen Hart is a 24 y.o. G72P0020 female at [redacted]w[redacted]d with PROM.   Plan:  1. Admit to Labor & Delivery; consents reviewed and obtained  2. Fetal Well being  - Fetal Tracing: Cat I - GBS neg - Presentation: VTX confirmed by SVE   3. Routine OB: - Prenatal labs reviewed, as above - Rh pos - CBC & T&S on admit - Clear fluids, IVF  4. Monitoring of Labor -  Contractions external toco in place -  Plan for continuous fetal monitoring  -  Maternal pain control as desired: IVPM, nitrous, regional anesthesia - Anticipate vaginal delivery  5. Post Partum Planning: - Infant feeding: Breast - Contraception: Possibly POPs  Phyllistine Domingos, CNM 12/18/2019 8:26 AM

## 2019-12-18 NOTE — OB Triage Note (Signed)
Pt is a G1P0 and [redacted]w[redacted]d presenting to L&D with c/o ctx every 5-6 minutes since 0100 today. Pt rates them 6/10 on a 0-10 pain scale. Pt states she feels good fetal movement and denies VB. Pt states she has been leaking fluid since 2300 on 12/17/19 and states "it looks like snot and is yellow with blood tinge." Monitors applied and assessing. Initial FHT [id number].

## 2019-12-18 NOTE — Anesthesia Procedure Notes (Signed)
Epidural Patient location during procedure: OB Start time: 12/18/2019 11:26 AM End time: 12/18/2019 11:31 AM  Staffing Anesthesiologist: Domonique Cothran, Cleda Mccreedy, MD Performed: anesthesiologist   Preanesthetic Checklist Completed: patient identified, IV checked, site marked, risks and benefits discussed, surgical consent, monitors and equipment checked, pre-op evaluation and timeout performed  Epidural Patient position: sitting Prep: ChloraPrep Patient monitoring: heart rate, continuous pulse ox and blood pressure Approach: midline Location: L3-L4 Injection technique: LOR saline  Needle:  Needle type: Tuohy  Needle gauge: 17 G Needle length: 9 cm and 9 Needle insertion depth: 8 cm Catheter type: closed end flexible Catheter size: 19 Gauge Catheter at skin depth: 13 cm Test dose: negative and 1.5% lidocaine with Epi 1:200 K  Assessment Sensory level: T10 Events: blood not aspirated, injection not painful, no injection resistance, no paresthesia and negative IV test  Additional Notes 1 attempt.  Patient endorsed transient right leg paresthesia with catheter advancement through Touhy needle that resolved with catheter retraction to 13 cm at the skin, 5 cm in epidural space  Pt. Evaluated and documentation done after procedure finished. Patient identified. Risks/Benefits/Options discussed with patient including but not limited to bleeding, infection, nerve damage, paralysis, failed block, incomplete pain control, headache, blood pressure changes, nausea, vomiting, reactions to medication both or allergic, itching and postpartum back pain. Confirmed with bedside nurse the patient's most recent platelet count. Confirmed with patient that they are not currently taking any anticoagulation, have any bleeding history or any family history of bleeding disorders. Patient expressed understanding and wished to proceed. All questions were answered. Sterile technique was used throughout the entire  procedure. Please see nursing notes for vital signs. Test dose was given through epidural catheter and negative prior to continuing to dose epidural or start infusion. Warning signs of high block given to the patient including shortness of breath, tingling/numbness in hands, complete motor block, or any concerning symptoms with instructions to call for help. Patient was given instructions on fall risk and not to get out of bed. All questions and concerns addressed with instructions to call with any issues or inadequate analgesia.   Patient tolerated the insertion well without immediate complications.Reason for block:procedure for pain

## 2019-12-18 NOTE — Lactation Note (Addendum)
This note was copied from a baby's chart. Lactation Consultation Note  Patient Name: Ellen Hart Date: 12/18/2019 Reason for consult: Initial assessment;Mother's request;Primapara;1st time breastfeeding;Term  Assisted with positioning and pillow support with first breast feeding in [facility] in football hold skin to skin on right breast.  Demonstrated how to easily hand express colostrum.  Mom said she had been leaking a lot in the pregnancy especially in the shower.  [name] latched with minimal assistance and began good rhythmic sucking which mom could feel as strong tugs but not hurting.  Explained feeding cues to parents encouraging them to put Ellen Hart to the breast whenever she demonstrated hunger cues.  Discussed normal newborn stomach size, supply and demand, normal course of lactation and routine newborn feeding patterns.  As new parents, they had lots of lactation questions which were addressed.  Encouraged mom to call any time she needed assistance.   Maternal Data Formula Feeding for Exclusion: No Has patient been taught Hand Expression?: Yes Does the patient have breastfeeding experience prior to this delivery?: No (Gr3, but P1)  Feeding Feeding Type: Breast Fed  LATCH Score Latch: Grasps breast easily, tongue down, lips flanged, rhythmical sucking.  Audible Swallowing: A few with stimulation  Type of Nipple: Everted at rest and after stimulation  Comfort (Breast/Nipple): Soft / non-tender  Hold (Positioning): Assistance needed to correctly position infant at breast and maintain latch.  LATCH Score: 8  Interventions Interventions: Breast feeding basics reviewed;Assisted with latch;Skin to skin;Breast massage;Hand express;Breast compression;Adjust position;Support pillows;Position options  Lactation Tools Discussed/Used WIC Program: Yes   Consult Status Consult Status: Follow-up Follow-up type: Call as needed    Louis Meckel 12/18/2019,  8:12 PM

## 2019-12-19 DIAGNOSIS — D62 Acute posthemorrhagic anemia: Secondary | ICD-10-CM | POA: Diagnosis not present

## 2019-12-19 LAB — CBC
HCT: 24.6 % — ABNORMAL LOW (ref 36.0–46.0)
Hemoglobin: 8.1 g/dL — ABNORMAL LOW (ref 12.0–15.0)
MCH: 26.9 pg (ref 26.0–34.0)
MCHC: 32.9 g/dL (ref 30.0–36.0)
MCV: 81.7 fL (ref 80.0–100.0)
Platelets: 385 10*3/uL (ref 150–400)
RBC: 3.01 MIL/uL — ABNORMAL LOW (ref 3.87–5.11)
RDW: 13.7 % (ref 11.5–15.5)
WBC: 14.2 10*3/uL — ABNORMAL HIGH (ref 4.0–10.5)
nRBC: 0 % (ref 0.0–0.2)

## 2019-12-19 LAB — RPR: RPR Ser Ql: NONREACTIVE

## 2019-12-19 MED ORDER — FERROUS SULFATE 325 (65 FE) MG PO TABS: 325.0000 mg | ORAL_TABLET | Freq: Two times a day (BID) | ORAL | Status: DC

## 2019-12-19 NOTE — Lactation Note (Addendum)
This note was copied from a baby's chart. Lactation Consultation Note  Patient Name: Ellen Hart Date: 12/19/2019   Mom wanting to go home with baby today after 24 hours.  This is a first time mom.  FOB is very supportive and has been helping mom with pillow support, positioning and latching.  Pediatrician wanting lactation to work with mom to become more independent with feedings.  Had mom call for LC to observe next feedings before discharge.  Hubert Azure has been preferring the right breast and mom has been able to latch with minimal assistance on that breast.  Worked with mom tweaking how to arrange pillows and position Ellen Hart in comfortable position up to the level of the breast with wide open mouth and flanged lips for a deep latch.  Mom's nipples are a little tender especially on the right which is his preferred breast.  Coconut oil and comfort gels given with instructions in alternating use.  Mom and baby seem more comfortable in the football hold.  Once FOB brought in the nursing pillow, she became comfortable in the cradle hold as well.  Mom is becoming more independent and confident with positioning, holding and latching with each feeding that was observed.  Mom has lots of colostrum that she can easily hand express to entice Ellen Hart to latch more easily.  She is able to sustain the latch for longer intervals with good audible swallows.  Lactation [employer] given with contact numbers and virtual support group and reviewed, encouraging parents to call with any questions, concerns or assistance.    Maternal Data    Feeding    LATCH Score                   Interventions    Lactation Tools Discussed/Used     Consult Status      Louis Meckel 12/19/2019, 7:15 PM

## 2019-12-19 NOTE — Progress Notes (Signed)
Post Partum Day 1  Subjective: Doing well, no concerns. Ambulating without difficulty, pain managed with PO meds, tolerating regular diet, and voiding without difficulty.   No fever/chills, chest pain, shortness of breath, nausea/vomiting, or leg pain. No nipple or breast pain.   Objective: BP 112/71 (BP Location: Right Arm)   Pulse 70   Temp 98.2 F (36.8 C) (Oral)   Resp 18   Ht 5\' 3"  (1.6 m)   Wt 98.4 kg   LMP 03/10/2019 (Approximate)   SpO2 97%   BMI 38.44 kg/m    Physical Exam:  General: alert and cooperative Breasts: soft/nontender CV: RRR Pulm: nl effort Abdomen: soft, non-tender Uterine Fundus: firm Perineum: minimal edema Lochia: appropriate DVT Evaluation: No evidence of DVT seen on physical exam. Edinburgh:  Edinburgh Postnatal Depression Scale Screening Tool 12/18/2019  I have been able to laugh and see the funny side of things. (No Data)     Recent Labs    12/18/19 0702 12/19/19 0535  HGB 9.5* 8.1*  HCT 29.6* 24.6*  WBC 13.7* 14.2*  PLT 489* 385    Assessment/Plan: 24 y.o. G3P0020 postpartum day # 1  -Continue routine postpartum care -Lactation consult PRN for breastfeeding   -Acute blood loss anemia - hemodynamically stable and asymptomatic; start PO ferrous sulfate BID with stool softeners  -Immunization status: all immunizations up to date  Disposition: Continue inpatient postpartum care Desires discharge home today   LOS: 1 day   Jalyah Weinheimer, CNM 12/19/2019, 11:43 AM

## 2019-12-19 NOTE — Plan of Care (Signed)
D/C order from J. Oxley CNM. Reviewed d/c instructions with patient and answered any questions. Patient d/c home with infant.

## 2019-12-19 NOTE — Discharge Instructions (Signed)
Postpartum Care After Vaginal Delivery °This sheet gives you information about how to care for yourself from the time you deliver your baby to up to 6-12 weeks after delivery (postpartum period). Your health care provider may also give you more specific instructions. If you have problems or questions, contact your health care provider. °Follow these instructions at home: °Vaginal bleeding °· It is normal to have vaginal bleeding (lochia) after delivery. Wear a sanitary pad for vaginal bleeding and discharge. °? During the first week after delivery, the amount and appearance of lochia is often similar to a menstrual period. °? Over the next few weeks, it will gradually decrease to a dry, yellow-brown discharge. °? For most women, lochia stops completely by 4-6 weeks after delivery. Vaginal bleeding can vary from woman to woman. °· Change your sanitary pads frequently. Watch for any changes in your flow, such as: °? A sudden increase in volume. °? A change in color. °? Large blood clots. °· If you pass a blood clot from your vagina, save it and call your health care provider to discuss. Do not flush blood clots down the toilet before talking with your health care provider. °· Do not use tampons or douches until your health care provider says this is safe. °· If you are not breastfeeding, your period should return 6-8 weeks after delivery. If you are feeding your child breast milk only (exclusive breastfeeding), your period may not return until you stop breastfeeding. °Perineal care °· Keep the area between the vagina and the anus (perineum) clean and dry as told by your health care provider. Use medicated pads and pain-relieving sprays and creams as directed. °· If you had a cut in the perineum (episiotomy) or a tear in the vagina, check the area for signs of infection until you are healed. Check for: °? More redness, swelling, or pain. °? Fluid or blood coming from the cut or tear. °? Warmth. °? Pus or a bad  smell. °· You may be given a squirt bottle to use instead of wiping to clean the perineum area after you go to the bathroom. As you start healing, you may use the squirt bottle before wiping yourself. Make sure to wipe gently. °· To relieve pain caused by an episiotomy, a tear in the vagina, or swollen veins in the anus (hemorrhoids), try taking a warm sitz bath 2-3 times a day. A sitz bath is a warm water bath that is taken while you are sitting down. The water should only come up to your hips and should cover your buttocks. °Breast care °· Within the first few days after delivery, your breasts may feel heavy, full, and uncomfortable (breast engorgement). Milk may also leak from your breasts. Your health care provider can suggest ways to help relieve the discomfort. Breast engorgement should go away within a few days. °· If you are breastfeeding: °? Wear a bra that supports your breasts and fits you well. °? Keep your nipples clean and dry. Apply creams and ointments as told by your health care provider. °? You may need to use breast pads to absorb milk that leaks from your breasts. °? You may have uterine contractions every time you breastfeed for up to several weeks after delivery. Uterine contractions help your uterus return to its normal size. °? If you have any problems with breastfeeding, work with your health care provider or lactation consultant. °· If you are not breastfeeding: °? Avoid touching your breasts a lot. Doing this can make   your breasts produce more milk. °? Wear a good-fitting bra and use cold packs to help with swelling. °? Do not squeeze out (express) milk. This causes you to make more milk. °Intimacy and sexuality °· Ask your health care provider when you can engage in sexual activity. This may depend on: °? Your risk of infection. °? How fast you are healing. °? Your comfort and desire to engage in sexual activity. °· You are able to get pregnant after delivery, even if you have not had  your period. If desired, talk with your health care provider about methods of birth control (contraception). °Medicines °· Take over-the-counter and prescription medicines only as told by your health care provider. °· If you were prescribed an antibiotic medicine, take it as told by your health care provider. Do not stop taking the antibiotic even if you start to feel better. °Activity °· Gradually return to your normal activities as told by your health care provider. Ask your health care provider what activities are safe for you. °· Rest as much as possible. Try to rest or take a nap while your baby is sleeping. °Eating and drinking ° °· Drink enough fluid to keep your urine pale yellow. °· Eat high-fiber foods every day. These may help prevent or relieve constipation. High-fiber foods include: °? Whole grain cereals and breads. °? Brown rice. °? Beans. °? Fresh fruits and vegetables. °· Do not try to lose weight quickly by cutting back on calories. °· Take your prenatal vitamins until your postpartum checkup or until your health care provider tells you it is okay to stop. °Lifestyle °· Do not use any products that contain nicotine or tobacco, such as cigarettes and e-cigarettes. If you need help quitting, ask your health care provider. °· Do not drink alcohol, especially if you are breastfeeding. °General instructions °· Keep all follow-up visits for you and your baby as told by your health care provider. Most women visit their health care provider for a postpartum checkup within the first 3-6 weeks after delivery. °Contact a health care provider if: °· You feel unable to cope with the changes that your child brings to your life, and these feelings do not go away. °· You feel unusually sad or worried. °· Your breasts become red, painful, or hard. °· You have a fever. °· You have trouble holding urine or keeping urine from leaking. °· You have little or no interest in activities you used to enjoy. °· You have not  breastfed at all and you have not had a menstrual period for 12 weeks after delivery. °· You have stopped breastfeeding and you have not had a menstrual period for 12 weeks after you stopped breastfeeding. °· You have questions about caring for yourself or your baby. °· You pass a blood clot from your vagina. °Get help right away if: °· You have chest pain. °· You have difficulty breathing. °· You have sudden, severe leg pain. °· You have severe pain or cramping in your lower abdomen. °· You bleed from your vagina so much that you fill more than one sanitary pad in one hour. Bleeding should not be heavier than your heaviest period. °· You develop a severe headache. °· You faint. °· You have blurred vision or spots in your vision. °· You have bad-smelling vaginal discharge. °· You have thoughts about hurting yourself or your baby. °If you ever feel like you may hurt yourself or others, or have thoughts about taking your own life, get help   right away. You can go to the nearest emergency department or call:  Your local emergency services (911 in the [place]  A suicide crisis helpline, such as the National Suicide Prevention Lifeline at 1-800-273-8255. This is open 24 hours a day. Summary  The period of time right after you deliver your newborn up to 6-12 weeks after delivery is called the postpartum period.  Gradually return to your normal activities as told by your health care provider.  Keep all follow-up visits for you and your baby as told by your health care provider. This information is not intended to replace advice given to you by your health care provider. Make sure you discuss any questions you have with your health care provider. Document Revised: 05/02/2017 Document Reviewed: 02/10/2017 Elsevier Patient Education  2020 [employer]  Postpartum Baby Blues The postpartum period begins right after the birth of a baby. During this time, there is often a lot of joy and excitement. It is also a  time of many changes in the life of the parents. No matter how many times a mother gives birth, each child brings new challenges to the family, including different ways of relating to one another. It is common to have feelings of excitement along with confusing changes in moods, emotions, and thoughts. You may feel happy one minute and sad or stressed the next. These feelings of sadness usually happen in the period right after you have your baby, and they go away within a week or two. This is called the "baby blues." What are the causes? There is no known cause of baby blues. It is likely caused by a combination of factors. However, changes in hormone levels after childbirth are believed to trigger some of the symptoms. Other factors that can play a role in these mood changes include:  Lack of sleep.  Stressful life events, such as poverty, caring for a loved one, or death of a loved one.  Genetics. What are the signs or symptoms? Symptoms of this condition include:  Brief changes in mood, such as going from extreme happiness to sadness.  Decreased concentration.  Difficulty sleeping.  Crying spells and tearfulness.  Loss of appetite.  Irritability.  Anxiety. If the symptoms of baby blues last for more than 2 weeks or become more severe, you may have postpartum depression. How is this diagnosed? This condition is diagnosed based on an evaluation of your symptoms. There are no medical or lab tests that lead to a diagnosis, but there are various questionnaires that a health care provider may use to identify women with the baby blues or postpartum depression. How is this treated? Treatment is not needed for this condition. The baby blues usually go away on their own in 1-2 weeks. Social support is often all that is needed. You will be encouraged to get adequate sleep and rest. Follow these instructions at home: Lifestyle      Get as much rest as you can. Take a nap when the baby  sleeps.  Exercise regularly as told by your health care provider. Some women find yoga and walking to be helpful.  Eat a balanced and nourishing diet. This includes plenty of fruits and vegetables, whole grains, and lean proteins.  Do little things that you enjoy. Have a cup of tea, take a bubble bath, read your favorite magazine, or listen to your favorite music.  Avoid alcohol.  Ask for help with household chores, cooking, grocery shopping, or running errands. Do not try to   to do everything yourself. Consider hiring a postpartum doula to help. This is a professional who specializes in providing support to new mothers.  Try not to make any major life changes during pregnancy or right after giving birth. This can add stress. General instructions  Talk to people close to you about how you are feeling. Get support from your partner, family members, friends, or other new moms. You may want to join a support group.  Find ways to cope with stress. This may include: ? Writing your thoughts and feelings in a journal. ? Spending time outside. ? Spending time with people who make you laugh.  Try to stay positive in how you think. Think about the things you are grateful for.  Take over-the-counter and prescription medicines only as told by your health care provider.  Let your health care provider know if you have any concerns.  Keep all postpartum visits as told by your health care provider. This is important. Contact a health care provider if:  Your baby blues do not go away after 2 weeks. Get help right away if:  You have thoughts of taking your own life (suicidal thoughts).  You think you may harm the baby or other people.  You see or hear things that are not there (hallucinations). Summary  After giving birth, you may feel happy one minute and sad or stressed the next. Feelings of sadness that happen right after the baby is born and go away after a week or two are called the "baby  blues."  You can manage the baby blues by getting enough rest, eating a healthy diet, exercising, spending time with supportive people, and finding ways to cope with stress.  If feelings of sadness and stress last longer than 2 weeks or get in the way of caring for your baby, talk to your health care provider. This may mean you have postpartum depression. This information is not intended to replace advice given to you by your health care provider. Make sure you discuss any questions you have with your health care provider. Document Revised: 08/21/2018 Document Reviewed: 06/25/2016 Elsevier Patient Education  2020 [employer].   Breastfeeding  Choosing to breastfeed is one of the best decisions you can make for yourself and your baby. A change in hormones during pregnancy causes your breasts to make breast milk in your milk-producing glands. Hormones prevent breast milk from being released before your baby is born. They also prompt milk flow after birth. Once breastfeeding has begun, thoughts of your baby, as well as his or her sucking or crying, can stimulate the release of milk from your milk-producing glands. Benefits of breastfeeding Research shows that breastfeeding offers many health benefits for infants and mothers. It also offers a cost-free and convenient way to feed your baby. For your baby  Your first milk (colostrum) helps your baby's digestive system to function better.  Special cells in your milk (antibodies) help your baby to fight off infections.  Breastfed babies are less likely to develop asthma, allergies, obesity, or type 2 diabetes. They are also at lower risk for sudden infant death syndrome (SIDS).  Nutrients in breast milk are better able to meet your babys needs compared to infant formula.  Breast milk improves your baby's brain development. For you  Breastfeeding helps to create a very special bond between you and your baby.  Breastfeeding is convenient.  Breast milk costs nothing and is always available at the correct temperature.  Breastfeeding helps to burn  calories. It helps you to lose the weight that you gained during pregnancy.  Breastfeeding makes your uterus return faster to its size before pregnancy. It also slows bleeding (lochia) after you give birth.  Breastfeeding helps to lower your risk of developing type 2 diabetes, osteoporosis, rheumatoid arthritis, cardiovascular disease, and breast, ovarian, uterine, and endometrial cancer later in life. Breastfeeding basics Starting breastfeeding  Find a comfortable place to sit or lie down, with your neck and back well-supported.  Place a pillow or a rolled-up blanket under your baby to bring him or her to the level of your breast (if you are seated). Nursing pillows are specially designed to help support your arms and your baby while you breastfeed.  Make sure that your baby's tummy (abdomen) is facing your abdomen.  Gently massage your breast. With your fingertips, massage from the outer edges of your breast inward toward the nipple. This encourages milk flow. If your milk flows slowly, you may need to continue this action during the feeding.  Support your breast with 4 fingers underneath and your thumb above your nipple (make the letter "C" with your hand). Make sure your fingers are well away from your nipple and your babys mouth.  Stroke your baby's lips gently with your finger or nipple.  When your baby's mouth is open wide enough, quickly bring your baby to your breast, placing your entire nipple and as much of the areola as possible into your baby's mouth. The areola is the colored area around your nipple. ? More areola should be visible above your baby's upper lip than below the lower lip. ? Your baby's lips should be opened and extended outward (flanged) to ensure an adequate, comfortable latch. ? Your baby's tongue should be between his or her lower gum and your  breast.  Make sure that your baby's mouth is correctly positioned around your nipple (latched). Your baby's lips should create a seal on your breast and be turned out (everted).  It is common for your baby to suck about 2-3 minutes in order to start the flow of breast milk. Latching Teaching your baby how to latch onto your breast properly is very important. An improper latch can cause nipple pain, decreased milk supply, and poor weight gain in your baby. Also, if your baby is not latched onto your nipple properly, he or she may swallow some air during feeding. This can make your baby fussy. Burping your baby when you switch breasts during the feeding can help to get rid of the air. However, teaching your baby to latch on properly is still the best way to prevent fussiness from swallowing air while breastfeeding. Signs that your baby has successfully latched onto your nipple  Silent tugging or silent sucking, without causing you pain. Infant's lips should be extended outward (flanged).  Swallowing heard between every 3-4 sucks once your milk has started to flow (after your let-down milk reflex occurs).  Muscle movement above and in front of his or her ears while sucking. Signs that your baby has not successfully latched onto your nipple  Sucking sounds or smacking sounds from your baby while breastfeeding.  Nipple pain. If you think your baby has not latched on correctly, slip your finger into the corner of your babys mouth to break the suction and place it between your baby's gums. Attempt to start breastfeeding again. Signs of successful breastfeeding Signs from your baby  Your baby will gradually decrease the number of sucks or will completely stop  sucking.  Your baby will fall asleep.  Your baby's body will relax.  Your baby will retain a small amount of milk in his or her mouth.  Your baby will let go of your breast by himself or herself. Signs from you  Breasts that have  increased in firmness, weight, and size 1-3 hours after feeding.  Breasts that are softer immediately after breastfeeding.  Increased milk volume, as well as a change in milk consistency and color by the fifth day of breastfeeding.  Nipples that are not sore, cracked, or bleeding. Signs that your baby is getting enough milk  Wetting at least 1-2 diapers during the first 24 hours after birth.  Wetting at least 5-6 diapers every 24 hours for the first week after birth. The urine should be clear or pale yellow by the age of 5 days.  Wetting 6-8 diapers every 24 hours as your baby continues to grow and develop.  At least 3 stools in a 24-hour period by the age of 5 days. The stool should be soft and yellow.  At least 3 stools in a 24-hour period by the age of 7 days. The stool should be seedy and yellow.  No loss of weight greater than 10% of birth weight during the first 3 days of life.  Average weight gain of 4-7 oz (113-198 g) per week after the age of 4 days.  Consistent daily weight gain by the age of 5 days, without weight loss after the age of 2 weeks. After a feeding, your baby may spit up a small amount of milk. This is normal. Breastfeeding frequency and duration Frequent feeding will help you make more milk and can prevent sore nipples and extremely full breasts (breast engorgement). Breastfeed when you feel the need to reduce the fullness of your breasts or when your baby shows signs of hunger. This is called "breastfeeding on demand." Signs that your baby is hungry include:  Increased alertness, activity, or restlessness.  Movement of the head from side to side.  Opening of the mouth when the corner of the mouth or cheek is stroked (rooting).  Increased sucking sounds, smacking lips, cooing, sighing, or squeaking.  Hand-to-mouth movements and sucking on fingers or hands.  Fussing or crying. Avoid introducing a pacifier to your baby in the first 4-6 weeks after your  baby is born. After this time, you may choose to use a pacifier. Research has shown that pacifier use during the first year of a baby's life decreases the risk of sudden infant death syndrome (SIDS). Allow your baby to feed on each breast as long as he or she wants. When your baby unlatches or falls asleep while feeding from the first breast, offer the second breast. Because newborns are often sleepy in the first few weeks of life, you may need to awaken your baby to get him or her to feed. Breastfeeding times will vary from baby to baby. However, the following rules can serve as a guide to help you make sure that your baby is properly fed:  Newborns (babies 79 weeks of age or younger) may breastfeed every 1-3 hours.  Newborns should not go without breastfeeding for longer than 3 hours during the day or 5 hours during the night.  You should breastfeed your baby a minimum of 8 times in a 24-hour period. Breast milk pumping     Pumping and storing breast milk allows you to make sure that your baby is exclusively fed your breast milk,  even at times when you are unable to breastfeed. This is especially important if you go back to work while you are still breastfeeding, or if you are not able to be present during feedings. Your lactation consultant can help you find a method of pumping that works best for you and give you guidelines about how long it is safe to store breast milk. Caring for your breasts while you breastfeed Nipples can become dry, cracked, and sore while breastfeeding. The following recommendations can help keep your breasts moisturized and healthy:  Avoid using soap on your nipples.  Wear a supportive bra designed especially for nursing. Avoid wearing underwire-style bras or extremely tight bras (sports bras).  Air-dry your nipples for 3-4 minutes after each feeding.  Use only cotton bra pads to absorb leaked breast milk. Leaking of breast milk between feedings is normal.  Use  lanolin on your nipples after breastfeeding. Lanolin helps to maintain your skin's normal moisture barrier. Pure lanolin is not harmful (not toxic) to your baby. You may also hand express a few drops of breast milk and gently massage that milk into your nipples and allow the milk to air-dry. In the first few weeks after giving birth, some women experience breast engorgement. Engorgement can make your breasts feel heavy, warm, and tender to the touch. Engorgement peaks within 3-5 days after you give birth. The following recommendations can help to ease engorgement:  Completely empty your breasts while breastfeeding or pumping. You may want to start by applying warm, moist heat (in the shower or with warm, water-soaked hand towels) just before feeding or pumping. This increases circulation and helps the milk flow. If your baby does not completely empty your breasts while breastfeeding, pump any extra milk after he or she is finished.  Apply ice packs to your breasts immediately after breastfeeding or pumping, unless this is too uncomfortable for you. To do this: ? Put ice in a plastic bag. ? Place a towel between your skin and the bag. ? Leave the ice on for 20 minutes, 2-3 times a day.  Make sure that your baby is latched on and positioned properly while breastfeeding. If engorgement persists after 48 hours of following these recommendations, contact your health care provider or a Advertising copywriter. Overall health care recommendations while breastfeeding  Eat 3 healthy meals and 3 snacks every day. Well-nourished mothers who are breastfeeding need an additional 450-500 calories a day. You can meet this requirement by increasing the amount of a balanced diet that you eat.  Drink enough water to keep your urine pale yellow or clear.  Rest often, relax, and continue to take your prenatal vitamins to prevent fatigue, stress, and low vitamin and mineral levels in your body (nutrient  deficiencies).  Do not use any products that contain nicotine or tobacco, such as cigarettes and e-cigarettes. Your baby may be harmed by chemicals from cigarettes that pass into breast milk and exposure to secondhand smoke. If you need help quitting, ask your health care provider.  Avoid alcohol.  Do not use illegal drugs or marijuana.  Talk with your health care provider before taking any medicines. These include over-the-counter and prescription medicines as well as vitamins and herbal supplements. Some medicines that may be harmful to your baby can pass through breast milk.  It is possible to become pregnant while breastfeeding. If birth control is desired, ask your health care provider about options that will be safe while breastfeeding your baby. Where to find more information:  [employer]: www.llli.org °Contact a health care provider if: °· You feel like you want to stop breastfeeding or have become frustrated with breastfeeding. °· Your nipples are cracked or bleeding. °· Your breasts are red, tender, or warm. °· You have: °? Painful breasts or nipples. °? A swollen area on either breast. °? A fever or chills. °? Nausea or vomiting. °? Drainage other than breast milk from your nipples. °· Your breasts do not become full before feedings by the fifth day after you give birth. °· You feel sad and depressed. °· Your baby is: °? Too sleepy to eat well. °? Having trouble sleeping. °? More than 1 week old and wetting fewer than 6 diapers in a 24-hour period. °? Not gaining weight by 5 days of age. °· Your baby has fewer than 3 stools in a 24-hour period. °· Your baby's skin or the white parts of his or her eyes become yellow. °Get help right away if: °· Your baby is overly tired (lethargic) and does not want to wake up and feed. °· Your baby develops an unexplained fever. °Summary °· Breastfeeding offers many health benefits for infant and mothers. °· Try to breastfeed your infant when  he or she shows early signs of hunger. °· Gently tickle or stroke your baby's lips with your finger or nipple to allow the baby to open his or her mouth. Bring the baby to your breast. Make sure that much of the areola is in your baby's mouth. Offer one side and burp the baby before you offer the other side. °· Talk with your health care provider or lactation consultant if you have questions or you face problems as you breastfeed. °This information is not intended to replace advice given to you by your health care provider. Make sure you discuss any questions you have with your health care provider. °Document Revised: 07/24/2017 Document Reviewed: 05/31/2016 °Elsevier Patient Education © 2020 [employer] ° °

## 2020-03-29 ENCOUNTER — Other Ambulatory Visit: Payer: Self-pay

## 2020-03-29 ENCOUNTER — Encounter: Payer: Self-pay | Admitting: *Deleted

## 2020-03-29 ENCOUNTER — Emergency Department
Admission: EM | Admit: 2020-03-29 | Discharge: 2020-03-29 | Disposition: A | Discharge status: Home / Self Care | Payer: Medicaid Other | Attending: Emergency Medicine | Admitting: Emergency Medicine

## 2020-03-29 DIAGNOSIS — J069 Acute upper respiratory infection, unspecified: Secondary | ICD-10-CM | POA: Diagnosis not present

## 2020-03-29 DIAGNOSIS — R0981 Nasal congestion: Secondary | ICD-10-CM | POA: Diagnosis present

## 2020-03-29 LAB — GROUP A STREP BY PCR: Group A Strep by PCR: NOT DETECTED

## 2020-03-29 MED ORDER — CIPROFLOXACIN-DEXAMETHASONE 0.3-0.1 % OT SUSP
4.0000 [drp] | Freq: Once | OTIC | Status: AC
  Administered 2020-03-29: 4 [drp] via OTIC
  Filled 2020-03-29: qty 7.5

## 2020-03-29 NOTE — ED Notes (Signed)
Pharmacy to send ear drops from main.

## 2020-03-29 NOTE — ED Provider Notes (Addendum)
[facility] Emergency Department Provider Note  ____________________________________________  Time seen: Approximately 4:38 AM  I have reviewed the triage vital signs and the nursing notes.   HISTORY  Chief Complaint Otalgia and Sore Throat   HPI Ellen Hart is a 24 y.o. female who presents for evaluation of otalgia and sore throat.  Patient symptoms started today.  Her child has had a viral URI at home.  No fever or chills.  Patient has had congestion, left ear pain and sore throat.  This morning she reports that her child was vomiting which woke her up.  She noticed blood from her ear onto her bed which prompted visit to the emergency room.  No cough, no chest pain or shortness of breath, no vomiting or diarrhea.   Past Medical History:  Diagnosis Date  . Migraine   . Ovarian cyst     Patient Active Problem List   Diagnosis Date Noted  . Acute blood loss anemia 12/19/2019  . NSVD (normal spontaneous vaginal delivery) 12/18/2019    Past Surgical History:  Procedure Laterality Date  . APPENDECTOMY    . WISDOM TOOTH EXTRACTION      Prior to Admission medications   Medication Sig Start Date End Date Taking? Authorizing Provider  ferrous sulfate 325 (65 FE) MG tablet Take 1 tablet (325 mg total) by mouth 2 (two) times daily with a meal. 12/19/19   Haroldine Laws, CNM  Prenatal Vit-Fe Fumarate-FA (MULTIVITAMIN-PRENATAL) 27-0.8 MG TABS tablet Take 1 tablet by mouth daily at 12 noon.    [provider]    Allergies Amoxicillin  No family history on file.  Social History Social History   Tobacco Use  . Smoking status: Never Smoker  . Smokeless tobacco: Never Used  Substance Use Topics  . Alcohol use: Yes    Comment: occ  . Drug use: Not Currently    Review of Systems  Constitutional: Negative for fever. Eyes: Negative for visual changes. ENT: + sore throat, congestion, and ear pain Neck: No neck pain  Cardiovascular:  Negative for chest pain. Respiratory: Negative for shortness of breath. Gastrointestinal: Negative for abdominal pain, vomiting or diarrhea. Genitourinary: Negative for dysuria. Musculoskeletal: Negative for back pain. Skin: Negative for rash. Neurological: Negative for headaches, weakness or numbness. Psych: No SI or HI  ____________________________________________   PHYSICAL EXAM:  VITAL SIGNS: ED Triage Vitals  Enc Vitals Group     BP 03/29/20 0203 (!) 104/55     Pulse Rate 03/29/20 0203 82     Resp 03/29/20 0203 18     Temp 03/29/20 0203 98.4 F (36.9 C)     Temp Source 03/29/20 0203 Oral     SpO2 03/29/20 0203 99 %     Weight 03/29/20 0201 202 lb (91.6 kg)     Height 03/29/20 0201 5\' 4"  (1.626 m)     Head Circumference --      Peak Flow --      Pain Score 03/29/20 0205 4     Pain Loc --      Pain Edu? --      Excl. in GC? --     Constitutional: Alert and oriented. Well appearing and in no apparent distress. HEENT:      Head: Normocephalic and atraumatic.         Eyes: Conjunctivae are normal. Sclera is non-icteric.       Mouth/Throat: Mucous membranes are moist.  Oropharynx is clear with no exudate, no tonsillar hypertrophy  or erythema, no herpangina      Ear: TM visualized and clear with mild irritation of the external ear canal, no bleeding      Neck: Supple with no signs of meningismus. Cardiovascular: Regular rate and rhythm.  Respiratory: Normal respiratory effort.  Musculoskeletal: No edema, cyanosis, or erythema of extremities. Neurologic: Normal speech and language. Face is symmetric. Moving all extremities. No gross focal neurologic deficits are appreciated. Skin: Skin is warm, dry and intact. No rash noted. Psychiatric: Mood and affect are normal. Speech and behavior are normal.  ____________________________________________   LABS (all labs ordered are listed, but only abnormal results are displayed)  Labs Reviewed  GROUP A STREP BY PCR    ____________________________________________  EKG  none  ____________________________________________  RADIOLOGY  none  ____________________________________________   PROCEDURES  Procedure(s) performed: None Procedures Critical Care performed:  None ____________________________________________   INITIAL IMPRESSION / ASSESSMENT AND PLAN / ED COURSE   24 y.o. female who presents for evaluation of congestion, otalgia, and sore throat.  Child at home with similar symptoms.  No fever, chest pain or shortness of breath.  She is well-appearing in no distress.  Oropharynx is normal with no signs of infection, no cervical lymphadenopathy, TM is visualized and clear with minimal erythema of the external canal on the left.  Most likely viral syndrome however since patient did have some bleeding earlier today and it looks inflamed we will put patient on topical Ciprodex. Strep negative.  Recommended supportive care for viral symptoms at home.  Discussed my standard return precautions and follow-up with PCP.      _____________________________________________ Please note:  Patient was evaluated in Emergency Department today for the symptoms described in the history of present illness. Patient was evaluated in the context of the global COVID-19 pandemic, which necessitated consideration that the patient might be at risk for infection with the SARS-CoV-2 virus that causes COVID-19. Institutional protocols and algorithms that pertain to the evaluation of patients at risk for COVID-19 are in a state of rapid change based on information released by regulatory bodies including the CDC and federal and state organizations. These policies and algorithms were followed during the patient's care in the ED.  Some ED evaluations and interventions may be delayed as a result of limited staffing during the pandemic.   [place] Controlled Substance Database was reviewed by  me. ____________________________________________   FINAL CLINICAL IMPRESSION(S) / ED DIAGNOSES   Final diagnoses:  Upper respiratory tract infection, unspecified type      NEW MEDICATIONS STARTED DURING THIS VISIT:  ED Discharge Orders    None       Note:  This document was prepared using Dragon voice recognition software and may include unintentional dictation errors.    Don Perking, [place], MD 03/29/20 0441    Nita Sickle, MD 03/29/20 [phone]

## 2020-03-29 NOTE — ED Triage Notes (Signed)
Pt has sore throat and left earache.  Pt states blood from left ear tonight.  Pt alert.

## 2020-07-20 ENCOUNTER — Other Ambulatory Visit: Payer: Self-pay

## 2020-07-20 DIAGNOSIS — D72829 Elevated white blood cell count, unspecified: Secondary | ICD-10-CM | POA: Insufficient documentation

## 2020-07-20 DIAGNOSIS — N939 Abnormal uterine and vaginal bleeding, unspecified: Secondary | ICD-10-CM | POA: Diagnosis not present

## 2020-07-20 LAB — COMPREHENSIVE METABOLIC PANEL
ALT: 21 U/L (ref 0–44)
AST: 17 U/L (ref 15–41)
Albumin: 4 g/dL (ref 3.5–5.0)
Alkaline Phosphatase: 83 U/L (ref 38–126)
Anion gap: 8 (ref 5–15)
BUN: 11 mg/dL (ref 6–20)
CO2: 25 mmol/L (ref 22–32)
Calcium: 9.1 mg/dL (ref 8.9–10.3)
Chloride: 106 mmol/L (ref 98–111)
Creatinine, Ser: 0.49 mg/dL (ref 0.44–1.00)
GFR, Estimated: >60 mL/min (ref >60)
Glucose, Bld: 99 mg/dL (ref 70–99)
Potassium: 3.6 mmol/L (ref 3.5–5.1)
Sodium: 139 mmol/L (ref 135–145)
Total Bilirubin: 0.5 mg/dL (ref 0.3–1.2)
Total Protein: 7.8 g/dL (ref 6.5–8.1)

## 2020-07-20 LAB — CBC WITH DIFFERENTIAL/PLATELET
Abs Immature Granulocytes: 0.06 10*3/uL (ref 0.00–0.07)
Basophils Absolute: 0 10*3/uL (ref 0.0–0.1)
Basophils Relative: 0 %
Eosinophils Absolute: 0.1 10*3/uL (ref 0.0–0.5)
Eosinophils Relative: 0 %
HCT: 39 % (ref 36.0–46.0)
Hemoglobin: 13.1 g/dL (ref 12.0–15.0)
Immature Granulocytes: 0 %
Lymphocytes Relative: 22 %
Lymphs Abs: 3 10*3/uL (ref 0.7–4.0)
MCH: 28.1 pg (ref 26.0–34.0)
MCHC: 33.6 g/dL (ref 30.0–36.0)
MCV: 83.7 fL (ref 80.0–100.0)
Monocytes Absolute: 0.9 10*3/uL (ref 0.1–1.0)
Monocytes Relative: 6 %
Neutro Abs: 9.6 10*3/uL — ABNORMAL HIGH (ref 1.7–7.7)
Neutrophils Relative %: 72 %
Platelets: 492 10*3/uL — ABNORMAL HIGH (ref 150–400)
RBC: 4.66 MIL/uL (ref 3.87–5.11)
RDW: 13.6 % (ref 11.5–15.5)
WBC: 13.6 10*3/uL — ABNORMAL HIGH (ref 4.0–10.5)
nRBC: 0 % (ref 0.0–0.2)

## 2020-07-20 LAB — URINALYSIS, COMPLETE (UACMP) WITH MICROSCOPIC
Bacteria, UA: NONE SEEN
Bilirubin Urine: NEGATIVE
Glucose, UA: NEGATIVE mg/dL
Ketones, ur: NEGATIVE mg/dL
Nitrite: NEGATIVE
Protein, ur: 30 mg/dL — AB
RBC / HPF: >50 RBC/hpf — ABNORMAL HIGH (ref 0–5)
Specific Gravity, Urine: 1.026 (ref 1.005–1.030)
pH: 5 (ref 5.0–8.0)

## 2020-07-20 LAB — POC URINE PREG, ED: Preg Test, Ur: NEGATIVE

## 2020-07-20 LAB — HCG, QUANTITATIVE, PREGNANCY: hCG, Beta Chain, Quant, S: 9 m[IU]/mL — ABNORMAL HIGH (ref <5)

## 2020-07-20 NOTE — ED Triage Notes (Signed)
Pt reports vaginal bleeding onset today. Reports positive pregnancy test on 07/11/20 and  changing pad 3 times today. Denies presence of clots. Reports progression from grey spotting to bright red. Associated lower abd cramping.

## 2020-07-20 NOTE — ED Notes (Signed)
POC preg test performed. Results neg.

## 2020-07-21 ENCOUNTER — Emergency Department: Payer: Medicaid Other

## 2020-07-21 ENCOUNTER — Emergency Department
Admission: EM | Admit: 2020-07-21 | Discharge: 2020-07-21 | Disposition: A | Discharge status: Home / Self Care | Payer: Medicaid Other | Attending: Emergency Medicine | Admitting: Emergency Medicine

## 2020-07-21 DIAGNOSIS — N939 Abnormal uterine and vaginal bleeding, unspecified: Secondary | ICD-10-CM

## 2020-07-21 DIAGNOSIS — R102 Pelvic and perineal pain: Secondary | ICD-10-CM

## 2020-07-21 LAB — WET PREP, GENITAL
Clue Cells Wet Prep HPF POC: NONE SEEN
Sperm: NONE SEEN
Trich, Wet Prep: NONE SEEN
Yeast Wet Prep HPF POC: NONE SEEN

## 2020-07-21 LAB — CHLAMYDIA/NGC RT PCR (ARMC ONLY)
Chlamydia Tr: NOT DETECTED
N gonorrhoeae: NOT DETECTED

## 2020-07-21 MED ORDER — ONDANSETRON 4 MG PO TBDP
4.0000 mg | ORAL_TABLET | Freq: Once | ORAL | Status: AC
  Administered 2020-07-21: 4 mg via ORAL
  Filled 2020-07-21: qty 1

## 2020-07-21 MED ORDER — ACETAMINOPHEN 500 MG PO TABS
1000.0000 mg | ORAL_TABLET | Freq: Once | ORAL | Status: AC
  Administered 2020-07-21: 1000 mg via ORAL
  Filled 2020-07-21: qty 2

## 2020-07-21 NOTE — ED Notes (Signed)
Patient transported to Ultrasound 

## 2020-07-21 NOTE — ED Provider Notes (Signed)
[facility] Emergency Department Provider Note  ____________________________________________   Event Date/Time   First MD Initiated Contact with Patient 07/21/20 0247     (approximate)  I have reviewed the triage vital signs and the nursing notes.   HISTORY  Chief Complaint Vaginal Bleeding and Possible Pregnancy    HPI Ellen Hart is a 25 y.o. female G3 P1 with history of ovarian cysts, migraines who presents to the emergency department with concerns for possible pregnancy and vaginal bleeding.  States that she had a daughter 7 months ago.  She reports she has had regular menstrual cycles for the past 5 months.  She states her menstrual cycle was due in the beginning of March.  She states when she did not have her period, she took home pregnancy test on Monday, March 7 and Wednesday, March 9 states both are positive.  She did have a small amount of spotting on Monday.  She states today she started having heavier vaginal bleeding that felt like her normal period with lower abdominal cramping.  No passage of clots or any tissue.  She states she contacted her OB/GYN and they recommended she come to the emergency department given she had positive pregnancy tests.  She denies any fevers, nausea, vomiting, vaginal discharge.  She has had some diarrhea.  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        Past Medical History:  Diagnosis Date  . Migraine   . Ovarian cyst     Patient Active Problem List   Diagnosis Date Noted  . Acute blood loss anemia 12/19/2019  . NSVD (normal spontaneous vaginal delivery) 12/18/2019    Past Surgical History:  Procedure Laterality Date  . APPENDECTOMY    . WISDOM TOOTH EXTRACTION      Prior to Admission medications   Medication Sig Start Date End Date Taking? Authorizing Provider  ferrous sulfate 325 (65 FE) MG tablet Take 1 tablet (325 mg total) by mouth 2 (two) times daily with a meal. 12/19/19    Haroldine Laws, CNM  Prenatal Vit-Fe Fumarate-FA (MULTIVITAMIN-PRENATAL) 27-0.8 MG TABS tablet Take 1 tablet by mouth daily at 12 noon.    [provider]    Allergies Amoxicillin and Sumatriptan  History reviewed. No pertinent family history.  Social History Social History   Tobacco Use  . Smoking status: Never Smoker  . Smokeless tobacco: Never Used  Substance Use Topics  . Alcohol use: Yes    Comment: occ  . Drug use: Not Currently    Review of Systems Constitutional: No fever. Eyes: No visual changes. ENT: No sore throat. Cardiovascular: Denies chest pain. Respiratory: Denies shortness of breath. Gastrointestinal: No nausea, vomiting.  + diarrhea. Genitourinary: Negative for dysuria. Musculoskeletal: Negative for back pain. Skin: Negative for rash. Neurological: Negative for focal weakness or numbness.  ____________________________________________   PHYSICAL EXAM:  VITAL SIGNS: ED Triage Vitals  Enc Vitals Group     BP 07/20/20 1940 126/74     Pulse Rate 07/20/20 1940 88     Resp 07/20/20 1940 18     Temp 07/20/20 1940 97.7 F (36.5 C)     Temp Source 07/20/20 1940 Oral     SpO2 07/20/20 1939 99 %     Weight --      Height 07/20/20 1940 5\' 3"  (1.6 m)     Head Circumference --      Peak Flow --      Pain Score 07/20/20 1940  3     Pain Loc --      Pain Edu? --      Excl. in GC? --    CONSTITUTIONAL: Alert and oriented and responds appropriately to questions. Well-appearing; well-nourished HEAD: Normocephalic EYES: Conjunctivae clear, pupils appear equal, EOM appear intact ENT: normal nose; moist mucous membranes NECK: Supple, normal ROM CARD: RRR; S1 and S2 appreciated; no murmurs, no clicks, no rubs, no gallops RESP: Normal chest excursion without splinting or tachypnea; breath sounds clear and equal bilaterally; no wheezes, no rhonchi, no rales, no hypoxia or respiratory distress, speaking full sentences ABD/GI: Normal bowel sounds;  non-distended; soft, non-tender, no rebound, no guarding, no peritoneal signs, no hepatosplenomegaly GU:  Normal external genitalia. No lesions, rashes noted. Patient has noted amount of dark red vaginal bleeding on exam coming from the cervical os without any appreciable discharge.  Cervix appears slightly dilated.  No tissue present.  Internal exam deferred due to patient's discomfort. BACK: The back appears normal EXT: Normal ROM in all joints; no deformity noted, no edema; no cyanosis SKIN: Normal color for age and race; warm; no rash on exposed skin NEURO: Moves all extremities equally PSYCH: The patient's mood and manner are appropriate.  ____________________________________________   LABS (all labs ordered are listed, but only abnormal results are displayed)  Labs Reviewed  WET PREP, GENITAL - Abnormal; Notable for the following components:      Result Value   WBC, Wet Prep HPF POC MODERATE (*)    All other components within normal limits  CBC WITH DIFFERENTIAL/PLATELET - Abnormal; Notable for the following components:   WBC 13.6 (*)    Platelets 492 (*)    Neutro Abs 9.6 (*)    All other components within normal limits  URINALYSIS, COMPLETE (UACMP) WITH MICROSCOPIC - Abnormal; Notable for the following components:   Color, Urine YELLOW (*)    APPearance CLOUDY (*)    Hgb urine dipstick LARGE (*)    Protein, ur 30 (*)    Leukocytes,Ua TRACE (*)    RBC / HPF >50 (*)    All other components within normal limits  HCG, QUANTITATIVE, PREGNANCY - Abnormal; Notable for the following components:   hCG, Beta Chain, Quant, S 9 (*)    All other components within normal limits  CHLAMYDIA/NGC RT PCR ([facility] ONLY)  COMPREHENSIVE METABOLIC PANEL  POC URINE PREG, ED   ____________________________________________  EKG  none ____________________________________________  RADIOLOGY I, Chandani Rogowski, personally viewed and evaluated these images (plain radiographs) as part of my  medical decision making, as well as reviewing the written report by the radiologist.  ED MD interpretation: No IUP visualized.  Official radiology report(s): US OB LESS THAN 14 WEEKS W/ OB TRANSVAGINAL AND DOPPLER  Result Date: 07/21/2020 CLINICAL DATA:  25 year old female with pelvic pain and vaginal bleeding. Quantitative beta HCG 9, also with negative urine pregnancy test. Estimated gestational age by LMP 6 weeks and 0 days. EXAM: OBSTETRIC <14 WK [place] AND TRANSVAGINAL OB US DOPPLER ULTRASOUND OF OVARIES TECHNIQUE: Both transabdominal and transvaginal ultrasound examinations were performed for complete evaluation of the gestation as well as the maternal uterus, adnexal regions, and pelvic cul-de-sac. Transvaginal technique was performed to assess early pregnancy. Color and duplex Doppler ultrasound was utilized to evaluate blood flow to the ovaries. COMPARISON:  None relevant. FINDINGS: Intrauterine gestational sac: None. Maternal uterus/adnexae: Bland appearing endometrial thickening up to 11 mm (image 62). Negative uterus otherwise, measuring 7.0 x 3.5 by 4.1 cm (volume 52 mL).  The right ovary measures 3.1 x 1.6 by 1. 2 cm (volume 3 mL) and demonstrates both color flow and spectral Doppler (images 93-95) vascularity. Left ovary measures 3.5 x 1.3 by 1.8 cm (volume 4 mL) with both color and spectral Doppler vascularity evident (images 51 through 55). No pelvic free fluid. IMPRESSION: 1. No IUP identified. Differential considerations include early IUP, failed IUP, and less likely occult ectopic pregnancy. Recommend serial quantitative beta HCG and follow-up ultrasound as necessary. 2. Both ovaries appear normal, negative for ovarian torsion. Electronically Signed   By: Odessa Fleming M.D.   On: 07/21/2020 06:34    ____________________________________________   PROCEDURES  Procedure(s) performed (including Critical Care):  Procedures   ____________________________________________   INITIAL IMPRESSION  / ASSESSMENT AND PLAN / ED COURSE  As part of my medical decision making, I reviewed the following data within the electronic MEDICAL RECORD NUMBER Nursing notes reviewed and incorporated, Labs reviewed , Old chart reviewed and Notes from prior ED visits         Patient here with vaginal bleeding with 2+ home pregnancy tests.  She shows me a picture of both of these tests which are clearly positive.  Her hCG here is 9.  Discussed with patient that I suspect that she has had a miscarriage.  Blood type is O+.  She does not need RhoGam.  Offered STD screening which patient would like done today.  She states she is not concerned that she has been exposed to an STD.  Labs show mild leukocytosis which may be reactive.  She is afebrile with benign abdominal exam.  Given complaints of abdominal discomfort, will obtain pelvic ultrasound with Doppler mostly to rule out torsion.  Have low suspicion that this is an ectopic given her menstrual cycle is only 10 days late and her hCG level is 9.  Will give Tylenol for pain.  Doubt appendicitis, colitis, diverticulitis.  Urine shows red blood cells but this is likely from her vaginal bleeding.  She is not having urinary symptoms today.  ED PROGRESS  Patient's ultrasound shows no IUP.  Ovaries appear normal with normal blood flow.  Wet prep unremarkable other than moderate white blood cells.  GC and Chlamydia negative.  Discussed these findings with patient and recommended that she have her hCG rechecked by her OB/GYN in 48 to 72 hours.  I suspect missed spontaneous AB given she had 2+ pregnancy test at home this week and her hCG level is only 9 at this time.  We discussed all return precautions.  Patient verbalized understanding is comfortable with this plan.  Recommended Tylenol over-the-counter as needed for pain at home.  At this time, I do not feel there is any life-threatening condition present. I have reviewed, interpreted and discussed all results (EKG, imaging,  lab, urine as appropriate) and exam findings with patient/family. I have reviewed nursing notes and appropriate previous records.  I feel the patient is safe to be discharged home without further emergent workup and can continue workup as an outpatient as needed. Discussed usual and customary return precautions. Patient/family verbalize understanding and are comfortable with this plan.  Outpatient follow-up has been provided as needed. All questions have been answered.  ____________________________________________   FINAL CLINICAL IMPRESSION(S) / ED DIAGNOSES  Final diagnoses:  Vaginal bleeding     ED Discharge Orders    None      *Please note:  Aly Cassell was evaluated in Emergency Department on 07/21/2020 for the symptoms described in the history  of present illness. She was evaluated in the context of the global COVID-19 pandemic, which necessitated consideration that the patient might be at risk for infection with the SARS-CoV-2 virus that causes COVID-19. Institutional protocols and algorithms that pertain to the evaluation of patients at risk for COVID-19 are in a state of rapid change based on information released by regulatory bodies including the CDC and federal and state organizations. These policies and algorithms were followed during the patient's care in the ED.  Some ED evaluations and interventions may be delayed as a result of limited staffing during and the pandemic.*   Note:  This document was prepared using Dragon voice recognition software and may include unintentional dictation errors.   Tenita Cue, Layla MawKristen N, DO 07/21/20 [phone]

## 2020-07-21 NOTE — ED Notes (Signed)
Patient reports bleeding/spotting x1 day. Patient reports + pregnancy test on 07/11/2020. Patient reports lower abdominal cramping as well. Patient reports hx of false negative pregnancy tests.

## 2020-07-21 NOTE — Discharge Instructions (Addendum)
I recommend that you follow-up with your OB/GYN in 48 to 72 hours to have your beta hCG rechecked.  Your hCG level today was 9.  This may be due to a very early pregnancy but also could be due to a miscarriage or a pregnancy outside of the uterus called an ectopic pregnancy.  If you begin having severe abdominal pain, vomiting and cannot stop, fever of 100.4 or higher, vaginal bleeding where you are soaking through more than 1 pad an hour for more than 2 straight hours, please return to the emergency department.

## 2020-07-21 NOTE — ED Notes (Signed)
Patient back from CT and requesting pain medication for lower abdominal and cramping back pain 9/10. MD aware. Plan to wait for [place] results before giving additional medication. Patient aware. Denies other needs.

## 2020-07-21 NOTE — ED Notes (Signed)
Patient back to room from US.

## 2020-10-23 ENCOUNTER — Emergency Department
Admission: EM | Admit: 2020-10-23 | Discharge: 2020-10-23 | Disposition: A | Discharge status: Home / Self Care | Payer: Medicaid Other | Attending: Emergency Medicine | Admitting: Emergency Medicine

## 2020-10-23 ENCOUNTER — Other Ambulatory Visit: Payer: Self-pay

## 2020-10-23 DIAGNOSIS — R202 Paresthesia of skin: Secondary | ICD-10-CM | POA: Diagnosis not present

## 2020-10-23 DIAGNOSIS — G43909 Migraine, unspecified, not intractable, without status migrainosus: Secondary | ICD-10-CM

## 2020-10-23 DIAGNOSIS — R519 Headache, unspecified: Secondary | ICD-10-CM | POA: Diagnosis present

## 2020-10-23 LAB — BASIC METABOLIC PANEL
Anion gap: 6 (ref 5–15)
BUN: 12 mg/dL (ref 6–20)
CO2: 27 mmol/L (ref 22–32)
Calcium: 9.1 mg/dL (ref 8.9–10.3)
Chloride: 104 mmol/L (ref 98–111)
Creatinine, Ser: 0.54 mg/dL (ref 0.44–1.00)
GFR, Estimated: >60 mL/min (ref >60)
Glucose, Bld: 98 mg/dL (ref 70–99)
Potassium: 3.6 mmol/L (ref 3.5–5.1)
Sodium: 137 mmol/L (ref 135–145)

## 2020-10-23 LAB — URINALYSIS, COMPLETE (UACMP) WITH MICROSCOPIC
Bilirubin Urine: NEGATIVE
Glucose, UA: NEGATIVE mg/dL
Hgb urine dipstick: NEGATIVE
Ketones, ur: NEGATIVE mg/dL
Nitrite: NEGATIVE
Protein, ur: NEGATIVE mg/dL
Specific Gravity, Urine: 1.014 (ref 1.005–1.030)
pH: 5 (ref 5.0–8.0)

## 2020-10-23 LAB — CBC
HCT: 36.5 % (ref 36.0–46.0)
Hemoglobin: 12.1 g/dL (ref 12.0–15.0)
MCH: 28 pg (ref 26.0–34.0)
MCHC: 33.2 g/dL (ref 30.0–36.0)
MCV: 84.5 fL (ref 80.0–100.0)
Platelets: 431 10*3/uL — ABNORMAL HIGH (ref 150–400)
RBC: 4.32 MIL/uL (ref 3.87–5.11)
RDW: 13.3 % (ref 11.5–15.5)
WBC: 12.5 10*3/uL — ABNORMAL HIGH (ref 4.0–10.5)
nRBC: 0 % (ref 0.0–0.2)

## 2020-10-23 LAB — POC URINE PREG, ED: Preg Test, Ur: NEGATIVE

## 2020-10-23 MED ORDER — PROCHLORPERAZINE EDISYLATE 10 MG/2ML IJ SOLN
10.0000 mg | Freq: Once | INTRAMUSCULAR | Status: AC
  Administered 2020-10-23: 10 mg via INTRAVENOUS
  Filled 2020-10-23: qty 2

## 2020-10-23 MED ORDER — SODIUM CHLORIDE 0.9 % IV BOLUS
1000.0000 mL | Freq: Once | INTRAVENOUS | Status: AC
  Administered 2020-10-23: 1000 mL via INTRAVENOUS

## 2020-10-23 NOTE — Discharge Instructions (Addendum)
Please seek medical attention for any high fevers, chest pain, shortness of breath, change in behavior, persistent vomiting, bloody stool or any other new or concerning symptoms.  

## 2020-10-23 NOTE — ED Triage Notes (Signed)
Pt comes with c/o right sided numbness, blurry vision. Pt states this started about hour ago while at work.  Pt states her head hurts and feels like a migraine. Pt states some dizziness.  Pt states hx of migraines.

## 2020-10-23 NOTE — ED Provider Notes (Signed)
[facility] Emergency Department Provider Note  ____________________________________________   I have reviewed the triage vital signs and the nursing notes.   HISTORY  Chief Complaint Numbness (/)   History limited by: Not Limited   HPI Ellen Hart is a 25 y.o. female who presents to the emergency department today because of concern for headache, vision change and arm tingling. The patient states that she was at work when she noticed decreased vision to her right eye. She then started having headache. The patient states she then noticed some right arm tingling as well. She states she has a history of migraines. She has had vision change with her migraines in the past but has never had the arm tingling. The patient denies any recent head trauma.    Records reviewed. Per medical record review patient has a history of migraine.  Past Medical History:  Diagnosis Date   Migraine    Ovarian cyst     Patient Active Problem List   Diagnosis Date Noted   Acute blood loss anemia 12/19/2019   NSVD (normal spontaneous vaginal delivery) 12/18/2019    Past Surgical History:  Procedure Laterality Date   APPENDECTOMY     WISDOM TOOTH EXTRACTION      Prior to Admission medications   Medication Sig Start Date End Date Taking? Authorizing Provider  ferrous sulfate 325 (65 FE) MG tablet Take 1 tablet (325 mg total) by mouth 2 (two) times daily with a meal. 12/19/19   Haroldine Laws, CNM  Prenatal Vit-Fe Fumarate-FA (MULTIVITAMIN-PRENATAL) 27-0.8 MG TABS tablet Take 1 tablet by mouth daily at 12 noon.    [provider]    Allergies Amoxicillin and Sumatriptan  No family history on file.  Social History Social History   Tobacco Use   Smoking status: Never   Smokeless tobacco: Never  Substance Use Topics   Alcohol use: Yes    Comment: occ   Drug use: Not Currently    Review of Systems Constitutional: No fever/chills Eyes: Positive for right  vision change.  ENT: No sore throat. Cardiovascular: Denies chest pain. Respiratory: Denies shortness of breath. Gastrointestinal: No abdominal pain.  No nausea, no vomiting.  No diarrhea.   Genitourinary: Negative for dysuria. Musculoskeletal: Negative for back pain. Skin: Negative for rash. Neurological: Positive for headache, positive for right arm tingling.   ____________________________________________   PHYSICAL EXAM:  VITAL SIGNS: ED Triage Vitals  Enc Vitals Group     BP 10/23/20 1516 126/62     Pulse Rate 10/23/20 1516 79     Resp 10/23/20 1516 19     Temp 10/23/20 1516 97.8 F (36.6 C)     Temp src --      SpO2 10/23/20 1516 94 %     Weight --      Height --      Head Circumference --      Peak Flow --      Pain Score 10/23/20 1512 8     Pain Loc --      Pain Edu? --      Excl. in GC? --      Constitutional: Alert and oriented.  Eyes: Conjunctivae are normal.  ENT      Head: Normocephalic and atraumatic.      Nose: No congestion/rhinnorhea.      Mouth/Throat: Mucous membranes are moist.      Neck: No stridor. Hematological/Lymphatic/Immunilogical: No cervical lymphadenopathy. Cardiovascular: Normal rate, regular rhythm.  No murmurs, rubs, or gallops. \  Respiratory: Normal respiratory effort without tachypnea nor retractions. Breath sounds are clear and equal bilaterally. No wheezes/rales/rhonchi. Gastrointestinal: Soft and non tender. No rebound. No guarding.  Genitourinary: Deferred Musculoskeletal: Normal range of motion in all extremities. No lower extremity edema. Neurologic:  Normal speech and language. No gross focal neurologic deficits are appreciated.  Skin:  Skin is warm, dry and intact. No rash noted. Psychiatric: Mood and affect are normal. Speech and behavior are normal. Patient exhibits appropriate insight and judgment.  ____________________________________________    LABS (pertinent positives/negatives)  BMP wnl CBC wbc 12.5, hgb  12.1, plt 431 Upreg negative UA hazy, moderate leukocytes, 6-10 wbc, 11-20 squamous ____________________________________________   EKG  I, Phineas Semen, attending physician, personally viewed and interpreted this EKG  EKG Time: 1516 Rate: 71 Rhythm: normal sinus rhythm Axis: normal Intervals: qtc 415 QRS: low voltage qrs ST changes: no st elevation Impression: abnormal ekg   ____________________________________________    RADIOLOGY  None  ____________________________________________   PROCEDURES  Procedures  ____________________________________________   INITIAL IMPRESSION / ASSESSMENT AND PLAN / ED COURSE  Pertinent labs & imaging results that were available during my care of the patient were reviewed by me and considered in my medical decision making (see chart for details).   Patient presented to the emergency department today because of concerns for headache.  Vision change and right arm tingling.  Patient does have a history of migraines.  She states she has had vision change in the past.  Patient was given IV fluids and Compazine here and did have resolution of her symptoms.  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  Doubt significant intracranial bleed, mass or infection.  Did discuss follow-up with patient.   ____________________________________________   FINAL CLINICAL IMPRESSION(S) / ED DIAGNOSES  Final diagnoses:  Migraine without status migrainosus, not intractable, unspecified migraine type     Note: This dictation was prepared with Dragon dictation. Any transcriptional errors that result from this process are unintentional     Phineas Semen, MD 10/23/20 1810

## 2020-10-25 LAB — URINE CULTURE

## 2021-02-16 ENCOUNTER — Telehealth: Payer: Self-pay

## 2021-02-16 NOTE — Telephone Encounter (Signed)
Please advise scheduling patietn for NP ER f/u

## 2021-02-16 NOTE — Telephone Encounter (Signed)
Pt calling; was seen in ER (not [facility]) for bleeding; Beta was done and she was told to have a repeat Beta today; she is still bleeding; she needs to establish care.  [phone]

## 2021-02-19 ENCOUNTER — Telehealth: Payer: Self-pay

## 2021-02-19 NOTE — Telephone Encounter (Signed)
Contacted patient via phone. Advise we are on a hold for new patient appointment. Advised patient's she would need to follow up with PCP or referred provider from her ER visit. Patient states she understands and will be seeing pcp.

## 2021-03-13 NOTE — Progress Notes (Deleted)
There were no vitals taken for this visit.   Subjective:    Patient ID: Ellen Hart, female    DOB: 1996-02-28, 25 y.o.   MRN: [id number]  HPI: Ellen Hart is a 25 y.o. female  No chief complaint on file.  Patient presents to clinic to establish care with new PCP.  Patient reports a history of ***. Patient denies a history of: Hypertension, Elevated Cholesterol, Diabetes, Thyroid problems, Depression, Anxiety, Neurological problems, and Abdominal problems.   Active Ambulatory Problems    Diagnosis Date Noted   NSVD (normal spontaneous vaginal delivery) 12/18/2019   Acute blood loss anemia 12/19/2019   Resolved Ambulatory Problems    Diagnosis Date Noted   Back pain affecting pregnancy in third trimester 12/04/2019   Pregnancy 12/18/2019   PROM (premature rupture of membranes) 12/18/2019   Past Medical History:  Diagnosis Date   Migraine    Ovarian cyst    Past Surgical History:  Procedure Laterality Date   APPENDECTOMY     WISDOM TOOTH EXTRACTION     No family history on file.     Review of Systems  Per HPI unless specifically indicated above     Objective:    There were no vitals taken for this visit.  Wt Readings from Last 3 Encounters:  03/29/20 202 lb (91.6 kg)  12/18/19 217 lb (98.4 kg)  12/04/19 (!) 221 lb (100.2 kg)    Physical Exam  Results for orders placed or performed during the hospital encounter of 10/23/20  Urine culture   Specimen: Urine, Random  Result Value Ref Range   Specimen Description      URINE, RANDOM Performed at [facility], [address] [place], [place] [address]    Special Requests      NONE Performed at [facility], [address] [place], [place] [address]    Culture MULTIPLE SPECIES PRESENT, SUGGEST RECOLLECTION (A)    Report Status 10/25/2020 FINAL   Basic metabolic panel  Result Value Ref Range   Sodium 137 135 - 145 mmol/L   Potassium 3.6 3.5 - 5.1 mmol/L   Chloride 104 98 -  111 mmol/L   CO2 27 22 - 32 mmol/L   Glucose, Bld 98 70 - 99 mg/dL   BUN 12 6 - 20 mg/dL   Creatinine, Ser 1.49 0.44 - 1.00 mg/dL   Calcium 9.1 8.9 - 70.2 mg/dL   GFR, Estimated >63 >78 mL/min   Anion gap 6 5 - 15  CBC  Result Value Ref Range   WBC 12.5 (H) 4.0 - 10.5 K/uL   RBC 4.32 3.87 - 5.11 MIL/uL   Hemoglobin 12.1 12.0 - 15.0 g/dL   HCT 58.8 50.2 - 77.4 %   MCV 84.5 80.0 - 100.0 fL   MCH 28.0 26.0 - 34.0 pg   MCHC 33.2 30.0 - 36.0 g/dL   RDW 12.8 78.6 - 76.7 %   Platelets 431 (H) 150 - 400 K/uL   nRBC 0.0 0.0 - 0.2 %  Urinalysis, Complete w Microscopic Urine, Clean Catch  Result Value Ref Range   Color, Urine YELLOW (A) YELLOW   APPearance HAZY (A) CLEAR   Specific Gravity, Urine 1.014 1.005 - 1.030   pH 5.0 5.0 - 8.0   Glucose, UA NEGATIVE NEGATIVE mg/dL   Hgb urine dipstick NEGATIVE NEGATIVE   Bilirubin Urine NEGATIVE NEGATIVE   Ketones, ur NEGATIVE NEGATIVE mg/dL   Protein, ur NEGATIVE NEGATIVE mg/dL   Nitrite NEGATIVE NEGATIVE  Leukocytes,Ua MODERATE (A) NEGATIVE   RBC / HPF 0-5 0 - 5 RBC/hpf   WBC, UA 6-10 0 - 5 WBC/hpf   Bacteria, UA RARE (A) NONE SEEN   Squamous Epithelial / LPF 11-20 0 - 5   Mucus PRESENT   POC urine preg, ED  Result Value Ref Range   Preg Test, Ur Negative Negative      Assessment & Plan:   Problem List Items Addressed This Visit   None Visit Diagnoses     Encounter to establish care    -  Primary        Follow up plan: No follow-ups on file.

## 2021-03-14 ENCOUNTER — Ambulatory Visit: Payer: Medicaid Other | Admitting: Nurse Practitioner

## 2021-03-14 DIAGNOSIS — Z7689 Persons encountering health services in other specified circumstances: Secondary | ICD-10-CM

## 2021-04-15 ENCOUNTER — Encounter: Payer: Self-pay | Admitting: Emergency Medicine

## 2021-04-15 ENCOUNTER — Other Ambulatory Visit: Payer: Self-pay

## 2021-04-15 ENCOUNTER — Emergency Department
Admission: EM | Admit: 2021-04-15 | Discharge: 2021-04-15 | Disposition: A | Discharge status: Home / Self Care | Payer: Medicaid Other | Attending: Emergency Medicine | Admitting: Emergency Medicine

## 2021-04-15 DIAGNOSIS — K047 Periapical abscess without sinus: Secondary | ICD-10-CM | POA: Insufficient documentation

## 2021-04-15 DIAGNOSIS — K0889 Other specified disorders of teeth and supporting structures: Secondary | ICD-10-CM | POA: Diagnosis present

## 2021-04-15 MED ORDER — HYDROCODONE-ACETAMINOPHEN 5-325 MG PO TABS: 1.0000 | ORAL_TABLET | ORAL | 0 refills | Status: DC | PRN

## 2021-04-15 MED ORDER — HYDROCODONE-ACETAMINOPHEN 5-325 MG PO TABS
1.0000 | ORAL_TABLET | Freq: Once | ORAL | Status: AC
  Administered 2021-04-15: 21:00:00 1 via ORAL
  Filled 2021-04-15: qty 1

## 2021-04-15 MED ORDER — LIDOCAINE VISCOUS HCL 2 % MT SOLN: 10.0000 mL | OROMUCOSAL | 0 refills | Status: DC | PRN

## 2021-04-15 MED ORDER — DOXYCYCLINE HYCLATE 100 MG PO TABS
100.0000 mg | ORAL_TABLET | Freq: Once | ORAL | Status: AC
  Administered 2021-04-15: 21:00:00 100 mg via ORAL
  Filled 2021-04-15: qty 1

## 2021-04-15 MED ORDER — DOXYCYCLINE HYCLATE 100 MG PO TABS: 100.0000 mg | ORAL_TABLET | Freq: Two times a day (BID) | ORAL | 0 refills | Status: DC

## 2021-04-15 NOTE — ED Provider Notes (Signed)
[facility] Emergency Department Provider Note  ____________________________________________  Time seen: Approximately 8:33 PM  I have reviewed the triage vital signs and the nursing notes.   HISTORY  Chief Complaint Dental Pain    HPI Ellen Hart is a 25 y.o. female who presents the emergency department complaining of left upper dental pain.  Patient has a broken tooth that was supposed to be extracted.  However at the time of her extraction her daughter was sick with RSV and she cannot go for her dental appointment.  She believes that she may have an infection and she is having some increased pain to the area.  She has an emerging see appointment with her dentist tomorrow morning but could not wait due to the pain.  No fevers or chills.  No difficulty breathing or swallowing.       Past Medical History:  Diagnosis Date   Migraine    Ovarian cyst     Patient Active Problem List   Diagnosis Date Noted   Acute blood loss anemia 12/19/2019   NSVD (normal spontaneous vaginal delivery) 12/18/2019    Past Surgical History:  Procedure Laterality Date   APPENDECTOMY     WISDOM TOOTH EXTRACTION      Prior to Admission medications   Medication Sig Start Date End Date Taking? Authorizing Provider  doxycycline (VIBRA-TABS) 100 MG tablet Take 1 tablet (100 mg total) by mouth 2 (two) times daily. 04/15/21  Yes Zyshonne Malecha, Delorise Royals, PA-C  HYDROcodone-acetaminophen (NORCO/VICODIN) 5-325 MG tablet Take 1 tablet by mouth every 4 (four) hours as needed for moderate pain. 04/15/21 04/15/22 Yes Ragnar Waas, Delorise Royals, PA-C  lidocaine (XYLOCAINE) 2 % solution Use as directed 10 mLs in the mouth or throat every 4 (four) hours as needed for mouth pain. Swish over affected dentition, spit 04/15/21  Yes Salinda Snedeker, Delorise Royals, PA-C  ferrous sulfate 325 (65 FE) MG tablet Take 1 tablet (325 mg total) by mouth 2 (two) times daily with a meal. 12/19/19   Haroldine Laws, CNM   Prenatal Vit-Fe Fumarate-FA (MULTIVITAMIN-PRENATAL) 27-0.8 MG TABS tablet Take 1 tablet by mouth daily at 12 noon.    [provider]    Allergies Amoxicillin and Sumatriptan  No family history on file.  Social History Social History   Tobacco Use   Smoking status: Never   Smokeless tobacco: Never  Substance Use Topics   Alcohol use: Yes    Comment: occ   Drug use: Not Currently     Review of Systems  Constitutional: No fever/chills Eyes: No visual changes. No discharge ENT: Left upper dental pain Cardiovascular: no chest pain. Respiratory: no cough. No SOB. Gastrointestinal: No abdominal pain.  No nausea, no vomiting.  No diarrhea.  No constipation. Musculoskeletal: Negative for musculoskeletal pain. Skin: Negative for rash, abrasions, lacerations, ecchymosis. Neurological: Negative for headaches, focal weakness or numbness.  10 System ROS otherwise negative.  ____________________________________________   PHYSICAL EXAM:  VITAL SIGNS: ED Triage Vitals  Enc Vitals Group     BP 04/15/21 1738 132/72     Pulse Rate 04/15/21 1738 76     Resp 04/15/21 1738 20     Temp 04/15/21 1738 98 F (36.7 C)     Temp Source 04/15/21 1738 Oral     SpO2 04/15/21 1738 96 %     Weight 04/15/21 1736 202 lb 13.2 oz (92 kg)     Height 04/15/21 1736 5\' 3"  (1.6 m)     Head Circumference --  Peak Flow --      Pain Score 04/15/21 1736 9     Pain Loc --      Pain Edu? --      Excl. in GC? --      Constitutional: Alert and oriented. Well appearing and in no acute distress. Eyes: Conjunctivae are normal. PERRL. EOMI. Head: Atraumatic. ENT:      Ears:       Nose: No congestion/rhinnorhea.      Mouth/Throat: Mucous membranes are moist.  Patient has a fractured tooth to the first molar left upper dentition.  No gross rounding erythema or edema.  Tender along the left cheek but there is no palpable abnormality and no edema or erythema of the cheek.  No gross findings in  the oropharynx Neck: No stridor.  No erythema or edema of the anterior neck or submandibular region Hematological/Lymphatic/Immunilogical: No cervical lymphadenopathy. Cardiovascular: Normal rate, regular rhythm. Normal S1 and S2.  Good peripheral circulation. Respiratory: Normal respiratory effort without tachypnea or retractions. Lungs CTAB. Good air entry to the bases with no decreased or absent breath sounds. Musculoskeletal: Full range of motion to all extremities. No gross deformities appreciated. Neurologic:  Normal speech and language. No gross focal neurologic deficits are appreciated.  Skin:  Skin is warm, dry and intact. No rash noted. Psychiatric: Mood and affect are normal. Speech and behavior are normal. Patient exhibits appropriate insight and judgement.   ____________________________________________   LABS (all labs ordered are listed, but only abnormal results are displayed)  Labs Reviewed - No data to display ____________________________________________  EKG   ____________________________________________  RADIOLOGY   No results found.  ____________________________________________    PROCEDURES  Procedure(s) performed:    Procedures    Medications  HYDROcodone-acetaminophen (NORCO/VICODIN) 5-325 MG per tablet 1 tablet (1 tablet Oral Given 04/15/21 2049)  doxycycline (VIBRA-TABS) tablet 100 mg (100 mg Oral Given 04/15/21 2049)     ____________________________________________   INITIAL IMPRESSION / ASSESSMENT AND PLAN / ED COURSE  Pertinent labs & imaging results that were available during my care of the patient were reviewed by me and considered in my medical decision making (see chart for details).  Review of the  [place] was performed in accordance of the [facility] prior to dispensing any controlled drugs.           Patient's diagnosis is consistent with dental infection.  Patient presented to the emergency department with left upper dental  pain.  She has a fractured tooth that needs to be extracted but when she was scheduled for extraction her daughter had an illness preventing her from having this procedure done.  Patient likely has a seeded infection to the area, but there is no concerns for deep space infection at this time.  Patient be treated with antibiotic, pain medication.  Patient has an emergency appointment with her dentist tomorrow morning..  Patient is given ED precautions to return to the ED for any worsening or new symptoms.     ____________________________________________  FINAL CLINICAL IMPRESSION(S) / ED DIAGNOSES  Final diagnoses:  Dental infection      NEW MEDICATIONS STARTED DURING THIS VISIT:  ED Discharge Orders          Ordered    doxycycline (VIBRA-TABS) 100 MG tablet  2 times daily        04/15/21 2054    HYDROcodone-acetaminophen (NORCO/VICODIN) 5-325 MG tablet  Every 4 hours PRN        04/15/21 2054    lidocaine (XYLOCAINE)  2 % solution  Every 4 hours PRN        04/15/21 2054                This chart was dictated using voice recognition software/Dragon. Despite best efforts to proofread, errors can occur which can change the meaning. Any change was purely unintentional.    Racheal Patches, PA-C 04/15/21 2055    Gilles Chiquito, MD 04/15/21 [phone]

## 2021-04-15 NOTE — ED Triage Notes (Signed)
Pt reports pain to left upper jaw. Pt states has an infected tooth and is supposed to get it removed but her kid got sick and she had to postpone it

## 2021-04-15 NOTE — ED Notes (Signed)
Pt stating "IM in a lot of pain"

## 2021-04-21 ENCOUNTER — Other Ambulatory Visit: Payer: Self-pay

## 2021-04-21 ENCOUNTER — Encounter: Payer: Self-pay | Admitting: Emergency Medicine

## 2021-04-21 ENCOUNTER — Emergency Department
Admission: EM | Admit: 2021-04-21 | Discharge: 2021-04-21 | Disposition: A | Discharge status: Home / Self Care | Payer: Medicaid Other | Attending: Emergency Medicine | Admitting: Emergency Medicine

## 2021-04-21 DIAGNOSIS — R519 Headache, unspecified: Secondary | ICD-10-CM | POA: Diagnosis present

## 2021-04-21 DIAGNOSIS — J101 Influenza due to other identified influenza virus with other respiratory manifestations: Secondary | ICD-10-CM | POA: Diagnosis not present

## 2021-04-21 DIAGNOSIS — Z20822 Contact with and (suspected) exposure to covid-19: Secondary | ICD-10-CM | POA: Insufficient documentation

## 2021-04-21 LAB — RESP PANEL BY RT-PCR (FLU A&B, COVID) ARPGX2
Influenza A by PCR: POSITIVE — AB
Influenza B by PCR: NEGATIVE
SARS Coronavirus 2 by RT PCR: NEGATIVE

## 2021-04-21 MED ORDER — PROCHLORPERAZINE MALEATE 10 MG PO TABS: 10.0000 mg | ORAL_TABLET | Freq: Three times a day (TID) | ORAL | 0 refills | Status: DC | PRN

## 2021-04-21 MED ORDER — ACETAMINOPHEN 500 MG PO TABS
1000.0000 mg | ORAL_TABLET | Freq: Once | ORAL | Status: AC
  Administered 2021-04-21: 1000 mg via ORAL
  Filled 2021-04-21: qty 2

## 2021-04-21 MED ORDER — PROCHLORPERAZINE MALEATE 10 MG PO TABS
10.0000 mg | ORAL_TABLET | Freq: Once | ORAL | Status: AC
  Administered 2021-04-21: 10 mg via ORAL
  Filled 2021-04-21: qty 1

## 2021-04-21 MED ORDER — IBUPROFEN 800 MG PO TABS
800.0000 mg | ORAL_TABLET | Freq: Once | ORAL | Status: AC
  Administered 2021-04-21: 800 mg via ORAL
  Filled 2021-04-21: qty 1

## 2021-04-21 NOTE — ED Triage Notes (Signed)
Pt to ED via EMS from home c/o headache, flu exposure, and also tooth abscess.  Taking IBU and hydrocodone at home.  EMS vitals 150/70, 100% RA, 107 HR, 98.5 temp.  Last IBU at 1900.  Pt A&Ox4, chest rise even and unlabored, skin WNL and in NAD at this time.

## 2021-04-21 NOTE — Discharge Instructions (Addendum)
You may alternate Tylenol 1000 mg every 6 hours as needed for pain, fever and Ibuprofen 800 mg every 6 hours as needed for pain, fever.  Please take Ibuprofen with food.  Do not take more than 4000 mg of Tylenol (acetaminophen) in a 24 hour period.

## 2021-04-21 NOTE — ED Provider Notes (Signed)
[facility] Emergency Department Provider Note  ____________________________________________   Event Date/Time   First MD Initiated Contact with Patient 04/21/21 [phone]     (approximate)  I have reviewed the triage vital signs and the nursing notes.   HISTORY  Chief Complaint Headache    HPI Ellen Hart is a 25 y.o. female with history of migraines, ovarian cyst, obesity who presents to the emergency department complaints of severe headache, fevers, chills, cough and sore throat that started today.  She has a child at home that tested positive for influenza.  Was just here for dental pain and was prescribed doxycycline and placed on hydrocodone.  Took hydrocodone prior to arrival but states she is still having a severe headache.  No neck pain or neck stiffness.  No head injury.  No numbness, tingling or weakness.  No nausea, vomiting or diarrhea.      Past Medical History:  Diagnosis Date   Migraine    Ovarian cyst     Patient Active Problem List   Diagnosis Date Noted   Acute blood loss anemia 12/19/2019   NSVD (normal spontaneous vaginal delivery) 12/18/2019    Past Surgical History:  Procedure Laterality Date   APPENDECTOMY     WISDOM TOOTH EXTRACTION      Prior to Admission medications   Medication Sig Start Date End Date Taking? Authorizing Provider  prochlorperazine (COMPAZINE) 10 MG tablet Take 1 tablet (10 mg total) by mouth every 8 (eight) hours as needed for nausea or vomiting. 04/21/21  Yes Gearldene Fiorenza, Layla Maw, DO  doxycycline (VIBRA-TABS) 100 MG tablet Take 1 tablet (100 mg total) by mouth 2 (two) times daily. 04/15/21   Cuthriell, Delorise Royals, PA-C  ferrous sulfate 325 (65 FE) MG tablet Take 1 tablet (325 mg total) by mouth 2 (two) times daily with a meal. 12/19/19   Haroldine Laws, CNM  HYDROcodone-acetaminophen (NORCO/VICODIN) 5-325 MG tablet Take 1 tablet by mouth every 4 (four) hours as needed for moderate pain. 04/15/21 04/15/22   Cuthriell, Delorise Royals, PA-C  lidocaine (XYLOCAINE) 2 % solution Use as directed 10 mLs in the mouth or throat every 4 (four) hours as needed for mouth pain. Swish over affected dentition, spit 04/15/21   Cuthriell, Delorise Royals, PA-C  Prenatal Vit-Fe Fumarate-FA (MULTIVITAMIN-PRENATAL) 27-0.8 MG TABS tablet Take 1 tablet by mouth daily at 12 noon.    [provider]    Allergies Amoxicillin and Sumatriptan  History reviewed. No pertinent family history.  Social History Social History   Tobacco Use   Smoking status: Never   Smokeless tobacco: Never  Substance Use Topics   Alcohol use: Yes    Comment: occ   Drug use: Not Currently    Review of Systems Constitutional: No fever. Eyes: No visual changes. ENT: No sore throat. Cardiovascular: Denies chest pain. Respiratory: Denies shortness of breath. Gastrointestinal: No nausea, vomiting, diarrhea. Genitourinary: Negative for dysuria. Musculoskeletal: Negative for back pain. Skin: Negative for rash. Neurological: Negative for focal weakness or numbness.  ____________________________________________   PHYSICAL EXAM:  VITAL SIGNS: ED Triage Vitals  Enc Vitals Group     BP 04/21/21 0305 134/79     Pulse Rate 04/21/21 0305 (!) 108     Resp 04/21/21 0305 18     Temp 04/21/21 0305 100.2 F (37.9 C)     Temp Source 04/21/21 0305 Oral     SpO2 04/21/21 0305 95 %     Weight 04/21/21 0306 206 lb (93.4 kg)  Height 04/21/21 0306 5\' 3"  (1.6 m)     Head Circumference --      Peak Flow --      Pain Score 04/21/21 0315 7     Pain Loc --      Pain Edu? --      Excl. in [place]? --    CONSTITUTIONAL: Alert and oriented and responds appropriately to questions. Well-appearing; well-nourished HEAD: Normocephalic, atraumatic EYES: Conjunctivae clear, pupils appear equal, EOM appear intact ENT: normal nose; moist mucous membranes; No pharyngeal erythema or petechiae, no tonsillar hypertrophy or exudate, no uvular deviation, no  unilateral swelling, no trismus or drooling, no muffled voice, normal phonation, no stridor, no dental caries present, no drainable dental abscess noted, no Ludwig's angina, tongue sits flat in the bottom of the mouth, no angioedema, no facial erythema or warmth, no facial swelling; no pain with movement of the neck, no cervical LAD.  TMs are clear bilaterally without erythema, purulence, bulging, perforation, effusion.  No cerumen impaction or sign of foreign body in the external auditory canal. No inflammation, erythema or drainage from the external auditory canal. No signs of mastoiditis. No pain with manipulation of the pinna bilaterally. NECK: Supple, normal ROM, no meningismus CARD: Regular and minimally tachycardic; S1 and S2 appreciated; no murmurs, no clicks, no rubs, no gallops RESP: Normal chest excursion without splinting or tachypnea; breath sounds clear and equal bilaterally; no wheezes, no rhonchi, no rales, no hypoxia or respiratory distress, speaking full sentences ABD/GI: Normal bowel sounds; non-distended; soft, non-tender, no rebound, no guarding, no peritoneal signs, no hepatosplenomegaly BACK: The back appears normal EXT: Normal ROM in all joints; no deformity noted, no edema; no cyanosis SKIN: Normal color for age and race; warm; no rash on exposed skin NEURO: Moves all extremities equally, normal speech, no facial asymmetry, normal gait PSYCH: The patient's mood and manner are appropriate.  ____________________________________________   LABS (all labs ordered are listed, but only abnormal results are displayed)  Labs Reviewed  RESP PANEL BY RT-PCR (FLU A&B, COVID) ARPGX2 - Abnormal; Notable for the following components:      Result Value   Influenza A by PCR POSITIVE (*)    All other components within normal limits   ____________________________________________  EKG   ____________________________________________  RADIOLOGY I, Shamanda Len, personally viewed and  evaluated these images (plain radiographs) as part of my medical decision making, as well as reviewing the written report by the radiologist.  ED MD interpretation:    Official radiology report(s): No results found.  ____________________________________________   PROCEDURES  Procedure(s) performed (including Critical Care):  Procedures    ____________________________________________   INITIAL IMPRESSION / ASSESSMENT AND PLAN / ED COURSE  As part of my medical decision making, I reviewed the following data within the electronic MEDICAL RECORD NUMBER Nursing notes reviewed and incorporated, Labs reviewed , Old chart reviewed, Notes from prior ED visits, and [place] Controlled Substance Database         Patient here with complaints of severe diffuse throbbing headache.  Tested positive for influenza.  Low-grade temperature here and mildly tachycardic.  Otherwise nontoxic and well-hydrated in appearance.  No meningismus.  Doubt meningitis.  Does not appear septic.  Doubt pneumonia, bacteremia.  Given ibuprofen and she states no improvement in her headache.  Discussed with patient that symptoms are due to influenza and will take time to improve.  We discussed risk and benefits of Tamiflu which she declines.  Discussed discharge and patient states "you are just going  to send me home in the same condition I came in with?"  Offered further pain medication which patient initially declines.  Offered IV, IM and oral medications.  Patient reluctantly then agrees to oral medications.  Will give Tylenol and Compazine and reassess.  ED PROGRESS  Patient provided medications by nursing staff and then requesting discharge home.  Provided with prescription for Compazine given she has history of migraines.  Discussed supportive care instructions at length with patient.  I feel she is safe for discharge.  Discussed return precautions.   At this time, I do not feel there is any life-threatening condition  present. I have reviewed, interpreted and discussed all results (EKG, imaging, lab, urine as appropriate) and exam findings with patient/family. I have reviewed nursing notes and appropriate previous records.  I feel the patient is safe to be discharged home without further emergent workup and can continue workup as an outpatient as needed. Discussed usual and customary return precautions. Patient/family verbalize understanding and are comfortable with this plan.  Outpatient follow-up has been provided as needed. All questions have been answered.  ____________________________________________   FINAL CLINICAL IMPRESSION(S) / ED DIAGNOSES  Final diagnoses:  Influenza A     ED Discharge Orders          Ordered    prochlorperazine (COMPAZINE) 10 MG tablet  Every 8 hours PRN        04/21/21 0713            *Please note:  Kaicee Scarpino was evaluated in Emergency Department on 04/21/2021 for the symptoms described in the history of present illness. She was evaluated in the context of the global COVID-19 pandemic, which necessitated consideration that the patient might be at risk for infection with the SARS-CoV-2 virus that causes COVID-19. Institutional protocols and algorithms that pertain to the evaluation of patients at risk for COVID-19 are in a state of rapid change based on information released by regulatory bodies including the CDC and federal and state organizations. These policies and algorithms were followed during the patient's care in the ED.  Some ED evaluations and interventions may be delayed as a result of limited staffing during and the pandemic.*   Note:  This document was prepared using Dragon voice recognition software and may include unintentional dictation errors.    Marchella Hibbard, Layla Maw, DO 04/21/21 [phone]

## 2021-04-21 NOTE — ED Notes (Signed)
Pt verbalizes understanding of d/c instructions, medications and follow up 

## 2021-05-01 NOTE — Telephone Encounter (Signed)
No message left

## 2021-10-25 DIAGNOSIS — F32A Depression, unspecified: Secondary | ICD-10-CM | POA: Insufficient documentation

## 2022-03-10 ENCOUNTER — Encounter: Payer: Self-pay | Admitting: Emergency Medicine

## 2022-03-10 ENCOUNTER — Ambulatory Visit
Admission: EM | Admit: 2022-03-10 | Discharge: 2022-03-10 | Disposition: A | Discharge status: Home / Self Care | Payer: Medicaid Other | Attending: Physician Assistant | Admitting: Physician Assistant

## 2022-03-10 DIAGNOSIS — Z3202 Encounter for pregnancy test, result negative: Secondary | ICD-10-CM | POA: Diagnosis not present

## 2022-03-10 DIAGNOSIS — Z3490 Encounter for supervision of normal pregnancy, unspecified, unspecified trimester: Secondary | ICD-10-CM

## 2022-03-10 DIAGNOSIS — Z3201 Encounter for pregnancy test, result positive: Secondary | ICD-10-CM

## 2022-03-10 DIAGNOSIS — J019 Acute sinusitis, unspecified: Secondary | ICD-10-CM | POA: Diagnosis present

## 2022-03-10 DIAGNOSIS — R0981 Nasal congestion: Secondary | ICD-10-CM

## 2022-03-10 LAB — PREGNANCY, URINE: Preg Test, Ur: POSITIVE — AB

## 2022-03-10 LAB — HCG, QUANTITATIVE, PREGNANCY: hCG, Beta Chain, Quant, S: 7274 m[IU]/mL — ABNORMAL HIGH (ref <5)

## 2022-03-10 MED ORDER — AZITHROMYCIN 250 MG PO TABS: 250.0000 mg | ORAL_TABLET | Freq: Every day | ORAL | 0 refills | Status: DC

## 2022-03-10 NOTE — ED Triage Notes (Signed)
Patient c/o cough, runny nose, and congestion for a week.  Patient denies fevers.  

## 2022-03-10 NOTE — Discharge Instructions (Addendum)
-  Pregnancy test was positive here today.  We have drawn a beta hCG blood test.  Someone will contact you with the results but follow-up with your OB/GYN with this on Thursday. - Switch to plain Robitussin and you may also take Tylenol and use nasal sprays, throat lozenges, throat sprays. - I have sent antibiotic for suspected sinus infection. - Increase rest and fluids. - Go to ER if you have any uncontrolled fever, weakness or breathing difficulty.

## 2022-03-10 NOTE — ED Provider Notes (Signed)
[facility]    CSN: [id number] Arrival date & time: 03/10/22  1505      History   Chief Complaint Chief Complaint  Patient presents with   Cough    HPI Ellen Hart is a 26 y.o. female presenting for cough, nasal congestion, headaches and fatigue x1 week.  Patient says over the past couple days her symptoms of gotten a lot worse and she has increased sinus pain and greenish nasal drainage where it was clear before.  She has not had any fevers, chest pain or breathing difficulty, vomiting or diarrhea.  Has been taking DayQuil and NyQuil for symptoms without relief.  Patient also reports that her last menstrual period was on 01/20/2022.  She reports that she took a pregnancy test at home this past week and it was positive.  She followed up with OB/GYN this past Tuesday and had an ultrasound performed which did not show a gestational sac.  She is to follow-up with OB/GYN in 4 more days for reevaluation.  She reports that she has a history of miscarriages.  She is not reporting any abdominal pain or abnormal vaginal bleeding.  HPI  Past Medical History:  Diagnosis Date   Migraine    Ovarian cyst     Patient Active Problem List   Diagnosis Date Noted   Acute blood loss anemia 12/19/2019   NSVD (normal spontaneous vaginal delivery) 12/18/2019    Past Surgical History:  Procedure Laterality Date   APPENDECTOMY     WISDOM TOOTH EXTRACTION      OB History     Gravida  3   Para      Term      Preterm      AB  2   Living  0      SAB  2   IAB      Ectopic      Multiple      Live Births               Home Medications    Prior to Admission medications   Medication Sig Start Date End Date Taking? Authorizing Provider  azithromycin (ZITHROMAX) 250 MG tablet Take 1 tablet (250 mg total) by mouth daily. Take first 2 tablets together, then 1 every day until finished. 03/10/22  Yes Shirlee Latch, PA-C  buPROPion (WELLBUTRIN XL) 300 MG 24 hr  tablet Take 300 mg by mouth every morning. 01/30/22   [provider]  ferrous sulfate 325 (65 FE) MG tablet Take 1 tablet (325 mg total) by mouth 2 (two) times daily with a meal. 12/19/19   Haroldine Laws, CNM  lidocaine (XYLOCAINE) 2 % solution Use as directed 10 mLs in the mouth or throat every 4 (four) hours as needed for mouth pain. Swish over affected dentition, spit 04/15/21   Cuthriell, Delorise Royals, PA-C  Prenatal Vit-Fe Fumarate-FA (MULTIVITAMIN-PRENATAL) 27-0.8 MG TABS tablet Take 1 tablet by mouth daily at 12 noon.    [provider]  prochlorperazine (COMPAZINE) 10 MG tablet Take 1 tablet (10 mg total) by mouth every 8 (eight) hours as needed for nausea or vomiting. 04/21/21   Ward, Layla Maw, DO    Family History History reviewed. No pertinent family history.  Social History Social History   Tobacco Use   Smoking status: Never   Smokeless tobacco: Never  Vaping Use   Vaping Use: Never used  Substance Use Topics   Alcohol use: Yes    Comment: occ  Drug use: Not Currently     Allergies   Amoxicillin and Sumatriptan   Review of Systems Review of Systems  Constitutional:  Positive for fatigue. Negative for chills, diaphoresis and fever.  HENT:  Positive for congestion, postnasal drip, rhinorrhea, sinus pressure, sinus pain and sore throat. Negative for ear pain.   Respiratory:  Positive for cough. Negative for shortness of breath.   Gastrointestinal:  Negative for abdominal pain, nausea and vomiting.  Genitourinary:  Negative for vaginal bleeding.  Musculoskeletal:  Negative for arthralgias and myalgias.  Skin:  Negative for rash.  Neurological:  Negative for weakness and headaches.  Hematological:  Negative for adenopathy.     Physical Exam Triage Vital Signs ED Triage Vitals  Enc Vitals Group     BP      Pulse      Resp      Temp      Temp src      SpO2      Weight      Height      Head Circumference      Peak Flow      Pain Score       Pain Loc      Pain Edu?      Excl. in [place]?    No data found.  Updated Vital Signs BP 101/69 (BP Location: Left Arm)   Pulse 88   Temp 98 F (36.7 C) (Oral)   Resp 14   Ht 5\' 3"  (1.6 m)   Wt 230 lb (104.3 kg)   LMP 01/20/2022 (Exact Date)   SpO2 97%   BMI 40.74 kg/m      Physical Exam Vitals and nursing note reviewed.  Constitutional:      General: She is not in acute distress.    Appearance: Normal appearance. She is not ill-appearing or toxic-appearing.  HENT:     Head: Normocephalic and atraumatic.     Nose: Congestion present.     Mouth/Throat:     Mouth: Mucous membranes are moist.     Pharynx: Oropharynx is clear. Posterior oropharyngeal erythema present.  Eyes:     General: No scleral icterus.       Right eye: No discharge.        Left eye: No discharge.     Conjunctiva/sclera: Conjunctivae normal.  Cardiovascular:     Rate and Rhythm: Normal rate and regular rhythm.     Heart sounds: Normal heart sounds.  Pulmonary:     Effort: Pulmonary effort is normal. No respiratory distress.     Breath sounds: Normal breath sounds.  Musculoskeletal:     Cervical back: Neck supple.  Skin:    General: Skin is dry.  Neurological:     General: No focal deficit present.     Mental Status: She is alert. Mental status is at baseline.     Motor: No weakness.     Gait: Gait normal.  Psychiatric:        Mood and Affect: Mood normal.        Behavior: Behavior normal.        Thought Content: Thought content normal.      UC Treatments / Results  Labs (all labs ordered are listed, but only abnormal results are displayed) Labs Reviewed  PREGNANCY, URINE - Abnormal; Notable for the following components:      Result Value   Preg Test, Ur POSITIVE (*)    All other components within normal limits  HCG, QUANTITATIVE,  PREGNANCY - Abnormal; Notable for the following components:   hCG, Beta Chain, Quant, S 7,274 (*)    All other components within normal limits     EKG   Radiology No results found.  Procedures Procedures (including critical care time)  Medications Ordered in UC Medications - No data to display  Initial Impression / Assessment and Plan / UC Course  I have reviewed the triage vital signs and the nursing notes.  Pertinent labs & imaging results that were available during my care of the patient were reviewed by me and considered in my medical decision making (see chart for details).   26 year old female presenting for cough and congestion x1 week.  Over the past 2 days she started to experience increased sinus pain and pressure with greenish nasal drainage.  Patient reports feeling worse.  Taking OTC cough medication without relief.  Patient also reporting her last menstrual period was on 01/20/2022 and had a positive at-home pregnancy test.  She is supposed to follow-up with OB/GYN on Thursday.  She did have an ultrasound performed this past week which did not show a gestational sac.  History of miscarriages.  Vitals normal stable patient is overall well-appearing.  On exam she does have nasal congestion and erythema posterior pharynx.  Tenderness of maxillary sinuses.  Chest clear auscultation heart regular rate and rhythm.  Urine pregnancy test is positive.  Discussed result with patient.  Will obtain beta-hCG serum test.  Patient to take these results to follow-up with OB/GYN with on Thursday.  Suspect acute sinusitis.  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  We will avoid doxycycline in possible pregnancy.  Sent azithromycin to pharmacy and advised her to switch to Robitussin and continue Tylenol and plenty of rest and fluids.  Reviewed returning if fever or worsening symptoms.   Final Clinical Impressions(s) / UC Diagnoses   Final diagnoses:  Acute sinusitis, recurrence not specified, unspecified location  Nasal congestion  Pregnancy test-positive     Discharge Instructions       -Pregnancy test was positive here today.  We have drawn a beta hCG blood test.  Someone will contact you with the results but follow-up with your OB/GYN with this on Thursday. - Switch to plain Robitussin and you may also take Tylenol and use nasal sprays, throat lozenges, throat sprays. - I have sent antibiotic for suspected sinus infection. - Increase rest and fluids. - Go to ER if you have any uncontrolled fever, weakness or breathing difficulty.     ED Prescriptions     Medication Sig Dispense Auth. Provider   azithromycin (ZITHROMAX) 250 MG tablet Take 1 tablet (250 mg total) by mouth daily. Take first 2 tablets together, then 1 every day until finished. 6 tablet Gareth Morgan      PDMP not reviewed this encounter.   Shirlee Latch, PA-C 03/10/22 1549    Shirlee Latch, PA-C 03/13/22 [phone]

## 2022-08-12 ENCOUNTER — Other Ambulatory Visit: Payer: Medicaid Other

## 2022-09-23 IMAGING — US US OB < 14 WKS - US OB TV - US DOPPLER
1 series · 13 of 28 positions shown · non-contrast
Comparison: None relevant.

CLINICAL DATA: 24-year-old female with pelvic pain and vaginal
bleeding. Quantitative beta HCG 9, also with negative urine
pregnancy test. Estimated gestational age by LMP 6 weeks and 0 days.



[Series 1: us ob less than 14 weeks w/ ob transvaginal and do · 13 of 106 slices shown]
[im 4/106]
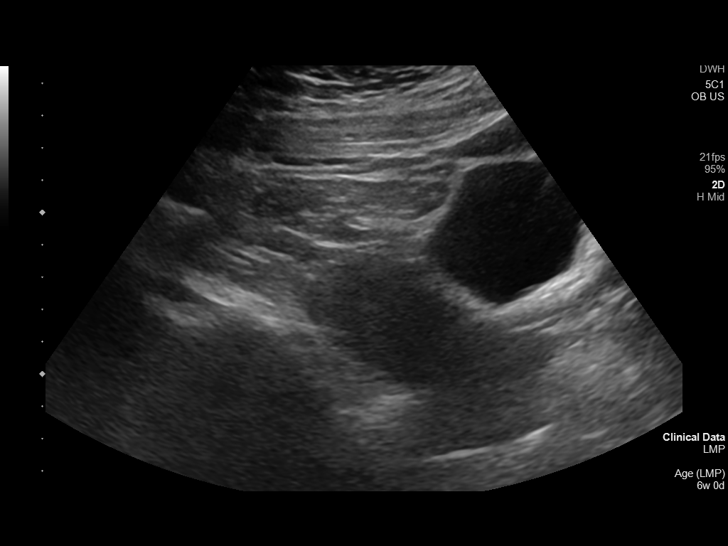
[im 12/106]
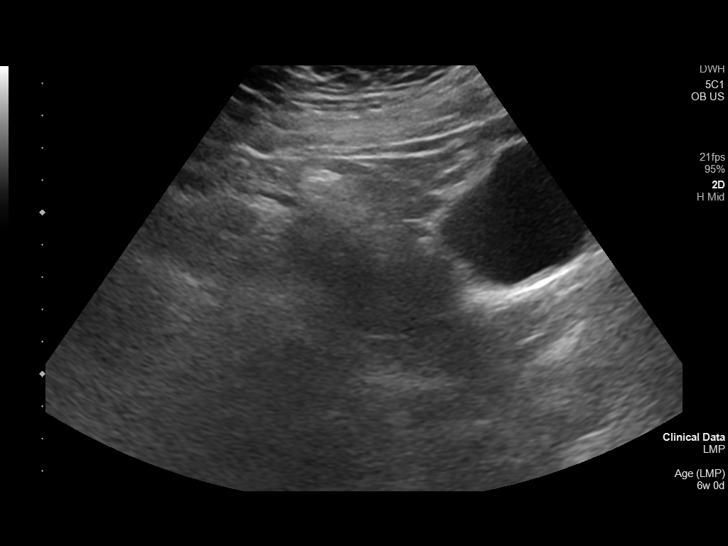
[im 20/106]
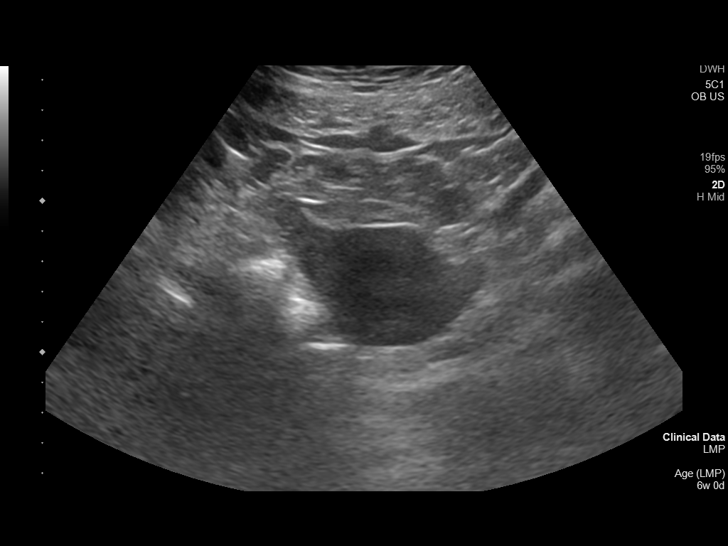
[im 28/106]
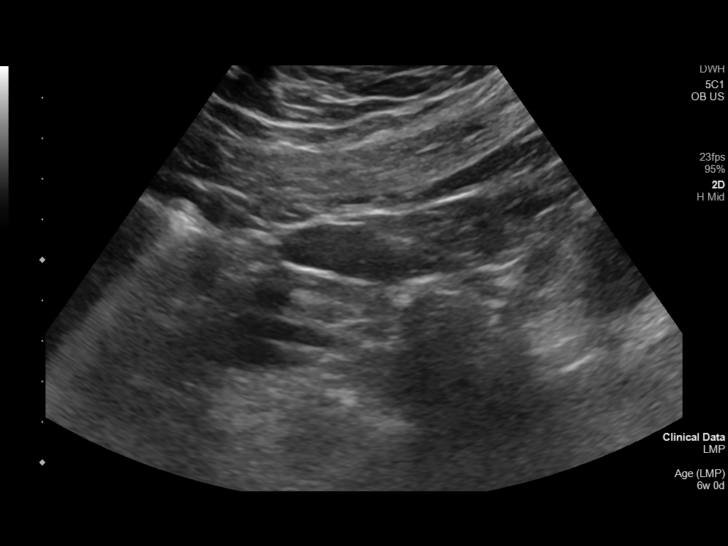
[im 36/106]
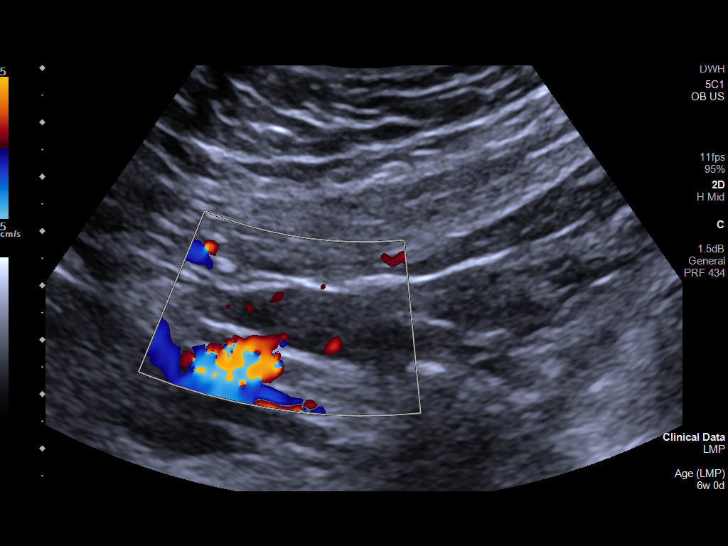
[im 43/106]
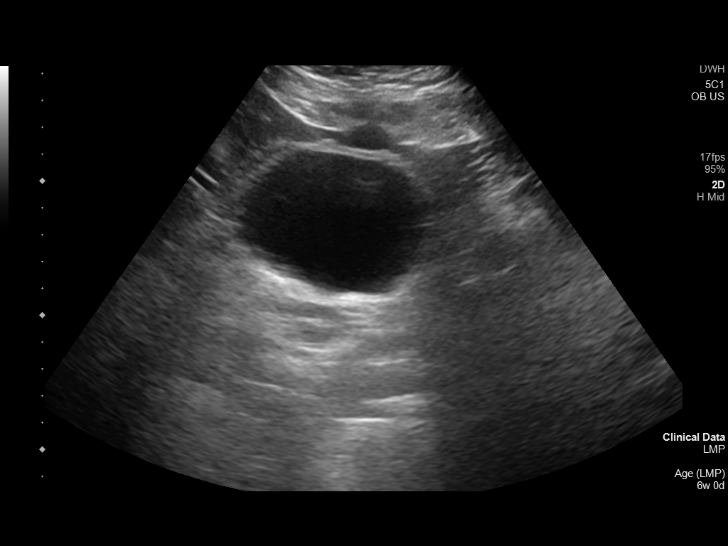
[im 55/106]
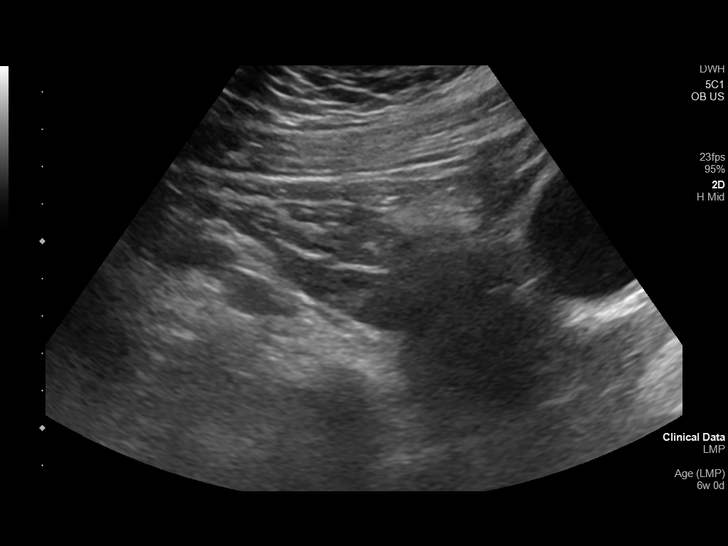
[im 63/106]
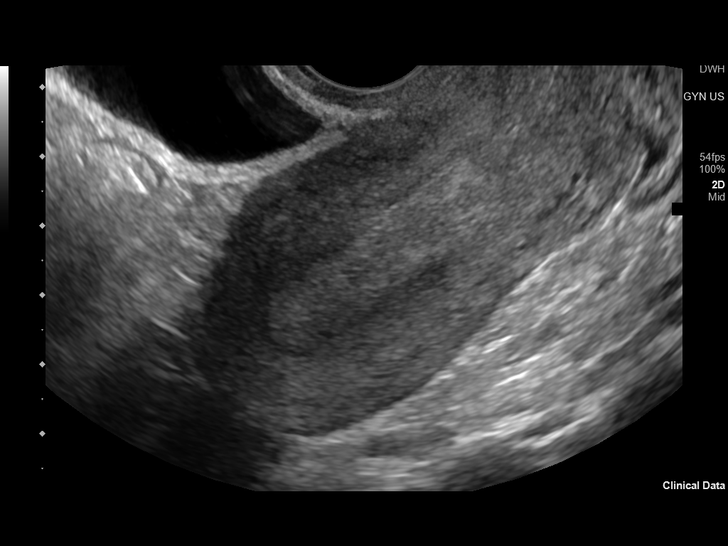
[im 71/106]
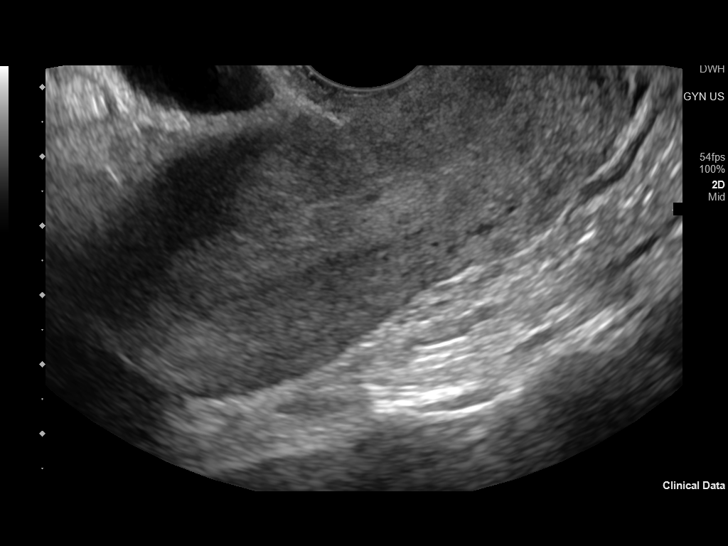
[im 78/106]
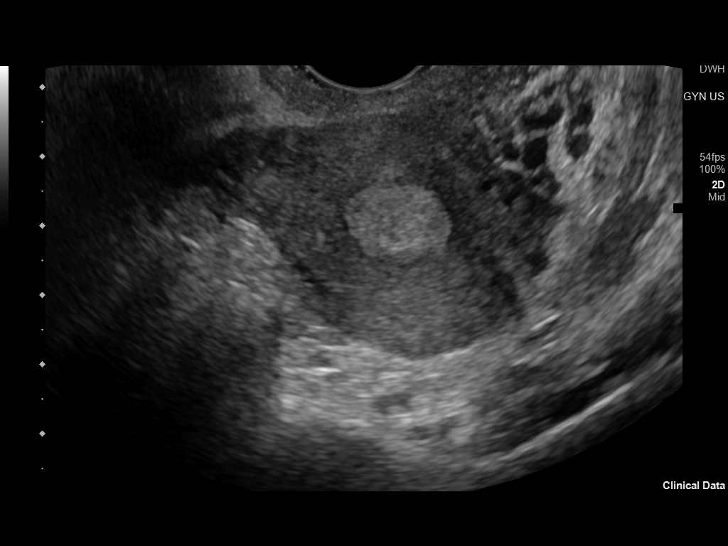
[im 86/106]
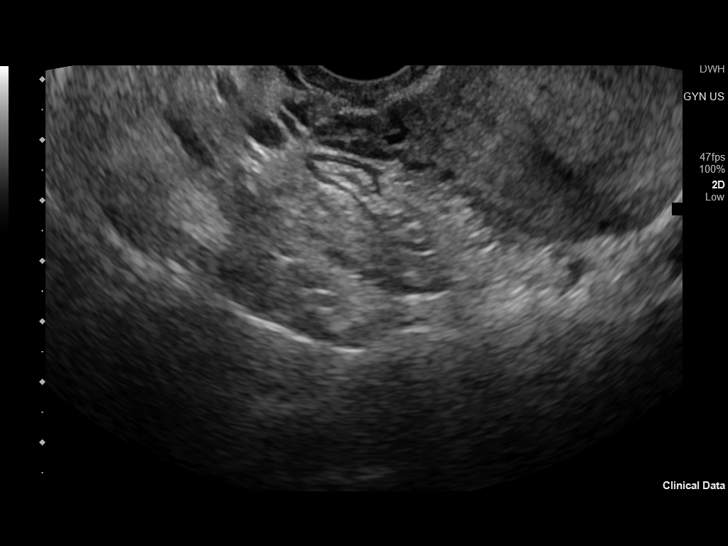
[im 94/106]
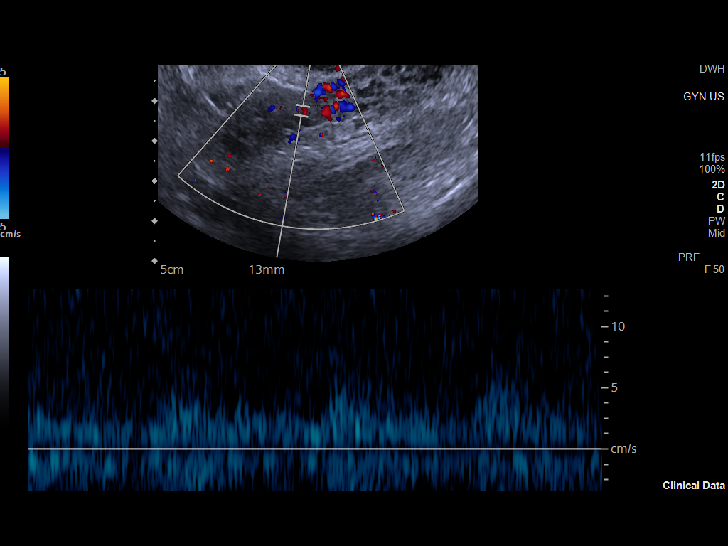
[im 102/106]
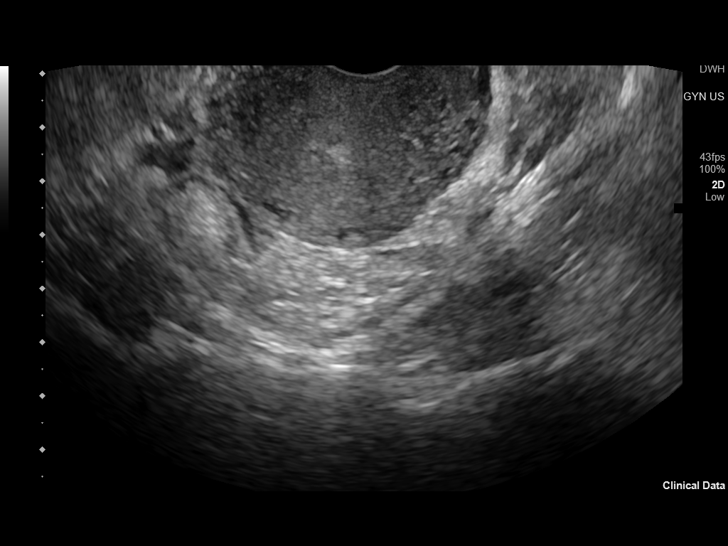

[13 of 28 positions shown; findings below may reference images not displayed]

FINDINGS: Intrauterine gestational sac: None.

Maternal uterus/adnexae:

Bland appearing endometrial thickening up to 11 mm (image 62).
Negative uterus otherwise, measuring 7.0 x 3.5 by 4.1 cm (volume 52
mL).

The right ovary measures 3.1 x 1.6 by 1. 2 cm (volume 3 mL) and
demonstrates both color flow and spectral Doppler (images 93-95)
vascularity.

Left ovary measures 3.5 x 1.3 by 1.8 cm (volume 4 mL) with both
color and spectral Doppler vascularity evident (images 51 through
55).

No pelvic free fluid.
IMPRESSION: 1. No IUP identified. Differential considerations include early IUP,
failed IUP, and less likely occult ectopic pregnancy. Recommend
serial quantitative beta HCG and follow-up ultrasound as necessary.

2. Both ovaries appear normal, negative for ovarian torsion.

## 2022-12-18 ENCOUNTER — Ambulatory Visit: Payer: Medicaid Other

## 2023-03-27 ENCOUNTER — Ambulatory Visit
Admission: EM | Admit: 2023-03-27 | Discharge: 2023-03-27 | Disposition: A | Discharge status: Home / Self Care | Payer: Medicaid Other | Attending: Emergency Medicine | Admitting: Emergency Medicine

## 2023-03-27 DIAGNOSIS — J069 Acute upper respiratory infection, unspecified: Secondary | ICD-10-CM | POA: Insufficient documentation

## 2023-03-27 LAB — SARS CORONAVIRUS 2 BY RT PCR: SARS Coronavirus 2 by RT PCR: NEGATIVE

## 2023-03-27 NOTE — Discharge Instructions (Addendum)
Your COVID test was negative , most likely you have a viral illness: no antibiotic as indicated at this time, May treat with OTC meds of choice. Make sure to drink plenty of fluids to stay hydrated(gatorade, water, popsicles,jello,etc), avoid caffeine products. Follow up with PCP. Return as needed.

## 2023-03-27 NOTE — ED Provider Notes (Signed)
[facility]    CSN: [id number] Arrival date & time: 03/27/23  1059      History   Chief Complaint Chief Complaint  Patient presents with   Nasal Congestion   Headache   Fatigue         HPI Kat Plaza is a 27 y.o. female.   27 year old female, Saline Demetrius, presents to urgent care for evaluation of Nasal congestion, headache, fatigue, temperature of 100.9.  States she was exposed to COVID recently, wants to get checked.  Patient is here with her daughter who has similar symptoms.  The history is provided by the patient. No language interpreter was used.    Past Medical History:  Diagnosis Date   Migraine    Ovarian cyst     Patient Active Problem List   Diagnosis Date Noted   Viral URI 03/27/2023   Acute blood loss anemia 12/19/2019   NSVD (normal spontaneous vaginal delivery) 12/18/2019    Past Surgical History:  Procedure Laterality Date   APPENDECTOMY     WISDOM TOOTH EXTRACTION      OB History     Gravida  3   Para      Term      Preterm      AB  2   Living  0      SAB  2   IAB      Ectopic      Multiple      Live Births               Home Medications    Prior to Admission medications   Medication Sig Start Date End Date Taking? Authorizing Provider  VYVANSE 30 MG capsule Take 1 capsule by mouth every morning. 03/04/23 04/03/23 Yes [provider]  azithromycin (ZITHROMAX) 250 MG tablet Take 1 tablet (250 mg total) by mouth daily. Take first 2 tablets together, then 1 every day until finished. 03/10/22   Shirlee Latch, PA-C  buPROPion (WELLBUTRIN XL) 300 MG 24 hr tablet Take 300 mg by mouth every morning. 01/30/22   [provider]  ferrous sulfate 325 (65 FE) MG tablet Take 1 tablet (325 mg total) by mouth 2 (two) times daily with a meal. 12/19/19   Haroldine Laws, CNM  lidocaine (XYLOCAINE) 2 % solution Use as directed 10 mLs in the mouth or throat every 4 (four) hours as needed for mouth  pain. Swish over affected dentition, spit 04/15/21   Cuthriell, Delorise Royals, PA-C  Prenatal Vit-Fe Fumarate-FA (MULTIVITAMIN-PRENATAL) 27-0.8 MG TABS tablet Take 1 tablet by mouth daily at 12 noon.    [provider]  prochlorperazine (COMPAZINE) 10 MG tablet Take 1 tablet (10 mg total) by mouth every 8 (eight) hours as needed for nausea or vomiting. 04/21/21   Ward, Layla Maw, DO    Family History History reviewed. No pertinent family history.  Social History Social History   Tobacco Use   Smoking status: Never   Smokeless tobacco: Never  Vaping Use   Vaping status: Never Used  Substance Use Topics   Alcohol use: Yes    Comment: occ   Drug use: Not Currently     Allergies   Amoxicillin and Sumatriptan   Review of Systems Review of Systems  Constitutional:  Positive for fatigue.  HENT:  Positive for congestion.   Neurological:  Positive for headaches.  All other systems reviewed and are negative.    Physical Exam Triage Vital Signs ED Triage  Vitals  Encounter Vitals Group     BP 03/27/23 1111 106/70     Systolic BP Percentile --      Diastolic BP Percentile --      Pulse Rate 03/27/23 1111 82     Resp --      Temp 03/27/23 1111 98.9 F (37.2 C)     Temp Source 03/27/23 1111 Oral     SpO2 03/27/23 1111 97 %     Weight 03/27/23 1110 209 lb (94.8 kg)     Height 03/27/23 1110 5\' 3"  (1.6 m)     Head Circumference --      Peak Flow --      Pain Score 03/27/23 1109 5     Pain Loc --      Pain Education --      Exclude from Growth Chart --    No data found.  Updated Vital Signs BP 106/70 (BP Location: Left Arm)   Pulse 82   Temp 98.9 F (37.2 C) (Oral)   Ht 5\' 3"  (1.6 m)   Wt 209 lb (94.8 kg)   LMP 02/25/2023   SpO2 97%   BMI 37.02 kg/m   Visual Acuity Right Eye Distance:   Left Eye Distance:   Bilateral Distance:    Right Eye Near:   Left Eye Near:    Bilateral Near:     Physical Exam Vitals and nursing note reviewed.  Constitutional:       Appearance: She is well-developed, well-groomed and overweight.  HENT:     Head: Normocephalic.     Right Ear: Tympanic membrane is retracted.     Left Ear: Tympanic membrane is retracted.     Nose: Congestion present.     Mouth/Throat:     Lips: Pink.     Mouth: Mucous membranes are moist.     Pharynx: Oropharynx is clear. Uvula midline.  Cardiovascular:     Rate and Rhythm: Normal rate and regular rhythm.     Pulses: Normal pulses.     Heart sounds: Normal heart sounds.  Pulmonary:     Effort: Pulmonary effort is normal.     Breath sounds: Normal breath sounds and air entry.  Neurological:     General: No focal deficit present.     Mental Status: She is alert and oriented to person, place, and time.     GCS: GCS eye subscore is 4. GCS verbal subscore is 5. GCS motor subscore is 6.     Cranial Nerves: No cranial nerve deficit.     Sensory: No sensory deficit.  Psychiatric:        Behavior: Behavior is cooperative.      UC Treatments / Results  Labs (all labs ordered are listed, but only abnormal results are displayed) Labs Reviewed  SARS CORONAVIRUS 2 BY RT PCR    EKG   Radiology No results found.  Procedures Procedures (including critical care time)  Medications Ordered in UC Medications - No data to display  Initial Impression / Assessment and Plan / UC Course  I have reviewed the triage vital signs and the nursing notes.  Pertinent labs & imaging results that were available during my care of the patient were reviewed by me and considered in my medical decision making (see chart for details).    Discussed exam findings and plan of care with patient, negative COVID test, strict go to ER precautions given.   Patient verbalized understanding to this provider.  Ddx: Viral  URI, allergies Final Clinical Impressions(s) / UC Diagnoses   Final diagnoses:  Viral URI     Discharge Instructions      Your COVID test was negative , most likely you have a  viral illness: no antibiotic as indicated at this time, May treat with OTC meds of choice. Make sure to drink plenty of fluids to stay hydrated(gatorade, water, popsicles,jello,etc), avoid caffeine products. Follow up with PCP. Return as needed.     ED Prescriptions   None    PDMP not reviewed this encounter.   Clancy Gourd, NP 03/27/23 1248

## 2023-03-27 NOTE — ED Triage Notes (Signed)
Pt c/o congestion, headache, fatigue, temperature of 100.9 x4days

## 2023-04-28 ENCOUNTER — Other Ambulatory Visit: Payer: Self-pay

## 2023-04-28 ENCOUNTER — Ambulatory Visit
Admission: EM | Admit: 2023-04-28 | Discharge: 2023-04-28 | Disposition: A | Discharge status: Home / Self Care | Payer: Medicaid Other | Attending: Emergency Medicine | Admitting: Emergency Medicine

## 2023-04-28 DIAGNOSIS — N926 Irregular menstruation, unspecified: Secondary | ICD-10-CM

## 2023-04-28 DIAGNOSIS — J0100 Acute maxillary sinusitis, unspecified: Secondary | ICD-10-CM | POA: Diagnosis not present

## 2023-04-28 DIAGNOSIS — Z3202 Encounter for pregnancy test, result negative: Secondary | ICD-10-CM

## 2023-04-28 LAB — POCT URINE PREGNANCY: Preg Test, Ur: NEGATIVE

## 2023-04-28 MED ORDER — IPRATROPIUM BROMIDE 0.03 % NA SOLN: 2.0000 | Freq: Two times a day (BID) | NASAL | 0 refills | Status: DC

## 2023-04-28 MED ORDER — PREDNISONE 10 MG (21) PO TBPK: ORAL_TABLET | Freq: Every day | ORAL | 0 refills | Status: DC

## 2023-04-28 NOTE — ED Triage Notes (Signed)
Provider triage  

## 2023-04-28 NOTE — Discharge Instructions (Addendum)
Your symptoms today are most likely being caused by a virus and should steadily improve in time it can take up to 7 to 10 days before you truly start to see a turnaround however things will get better  Begin prednisone every morning as directed to help reduce sinus pressure  Use nasal spray every morning and every evening for 10 days    You can take Tylenol and/or Ibuprofen as needed for fever reduction and pain relief.   For cough: honey 1/2 to 1 teaspoon (you can dilute the honey in water or another fluid).  You can also use guaifenesin and dextromethorphan for cough. You can use a humidifier for chest congestion and cough.  If you don't have a humidifier, you can sit in the bathroom with the hot shower running.      For sore throat: try warm salt water gargles, cepacol lozenges, throat spray, warm tea or water with lemon/honey, popsicles or ice, or OTC cold relief medicine for throat discomfort.   For congestion: take a daily anti-histamine like Zyrtec, Claritin, and a oral decongestant, such as pseudoephedrine.  You can also use Flonase 1-2 sprays in each nostril daily.   It is important to stay hydrated: drink plenty of fluids (water, gatorade/powerade/pedialyte, juices, or teas) to keep your throat moisturized and help further relieve irritation/discomfort.    Urine pregnancy test is negative, blood work is pending, you will be notified of positive test results only and will be able to see lab work on your MyChart account  Not use steroid medication until you receive negative test results of blood work

## 2023-04-28 NOTE — ED Provider Notes (Addendum)
 [facility]    CSN: [id number] Arrival date & time: 04/28/23  1300      History   Chief Complaint No chief complaint on file.   HPI Ellen Hart is a 27 y.o. female.   Patient presents for evaluation of nasal congestion, rhinorrhea, nonproductive cough present for 4 days.  Symptoms worsening at nighttime making it difficult to lie flat, interfering with sleep.  Has attempted use of honey with lemon and Tylenol  Sinus.  Poor appetite but able to tolerate some food and liquids.  Denies presence of fever.  Requesting pregnancy testing.  Last known menstruation November 13 through 15, currently 1 day late.  Endorses during last pregnancy she had several negative urine pregnancy test with only a positive hCG.    Past Medical History:  Diagnosis Date   Migraine    Ovarian cyst     Patient Active Problem List   Diagnosis Date Noted   Viral URI 03/27/2023   Acute blood loss anemia 12/19/2019   NSVD (normal spontaneous vaginal delivery) 12/18/2019    Past Surgical History:  Procedure Laterality Date   APPENDECTOMY     WISDOM TOOTH EXTRACTION      OB History     Gravida  3   Para      Term      Preterm      AB  2   Living  0      SAB  2   IAB      Ectopic      Multiple      Live Births               Home Medications    Prior to Admission medications   Medication Sig Start Date End Date Taking? Authorizing Provider  ipratropium (ATROVENT ) 0.03 % nasal spray Place 2 sprays into both nostrils every 12 (twelve) hours. 04/28/23  Yes Klare Criss R, NP  predniSONE  (STERAPRED UNI-PAK 21 TAB) 10 MG (21) TBPK tablet Take by mouth daily. Take 6 tabs by mouth daily  for 1 days, then 5 tabs for 1 days, then 4 tabs for 1 days, then 3 tabs for 1 days, 2 tabs for 1 days, then 1 tab by mouth daily for 1 days 04/28/23  Yes Raed Schalk R, NP  azithromycin  (ZITHROMAX ) 250 MG tablet Take 1 tablet (250 mg total) by mouth daily. Take first 2 tablets  together, then 1 every day until finished. 03/10/22   Floydene Hy, PA-C  buPROPion (WELLBUTRIN XL) 300 MG 24 hr tablet Take 300 mg by mouth every morning. 01/30/22   [provider]  ferrous sulfate  325 (65 FE) MG tablet Take 1 tablet (325 mg total) by mouth 2 (two) times daily with a meal. 12/19/19   Collin Deal, CNM  lidocaine  (XYLOCAINE ) 2 % solution Use as directed 10 mLs in the mouth or throat every 4 (four) hours as needed for mouth pain. Swish over affected dentition, spit 04/15/21   Cuthriell, Ardath Bears, PA-C  Prenatal Vit-Fe Fumarate-FA (MULTIVITAMIN-PRENATAL) 27-0.8 MG TABS tablet Take 1 tablet by mouth daily at 12 noon.    [provider]  prochlorperazine  (COMPAZINE ) 10 MG tablet Take 1 tablet (10 mg total) by mouth every 8 (eight) hours as needed for nausea or vomiting. 04/21/21   Ward, Clover Dao, DO  VYVANSE 30 MG capsule Take 1 capsule by mouth every morning. 03/04/23 04/03/23  [provider]    Family History No family history on file.  Social History Social History   Tobacco Use   Smoking status: Never   Smokeless tobacco: Never  Vaping Use   Vaping status: Never Used  Substance Use Topics   Alcohol use: Yes    Comment: occ   Drug use: Not Currently     Allergies   Amoxicillin and Sumatriptan   Review of Systems Review of Systems   Physical Exam Triage Vital Signs ED Triage Vitals [04/28/23 1406]  Encounter Vitals Group     BP 114/73     Systolic BP Percentile      Diastolic BP Percentile      Pulse Rate 78     Resp 17     Temp 98 F (36.7 C)     Temp src      SpO2 98 %     Weight      Height      Head Circumference      Peak Flow      Pain Score      Pain Loc      Pain Education      Exclude from Growth Chart    No data found.  Updated Vital Signs BP 114/73   Pulse 78   Temp 98 F (36.7 C)   Resp 17   SpO2 98%   Visual Acuity Right Eye Distance:   Left Eye Distance:   Bilateral Distance:     Right Eye Near:   Left Eye Near:    Bilateral Near:     Physical Exam Constitutional:      Appearance: Normal appearance.  HENT:     Head: Normocephalic.     Right Ear: Tympanic membrane, ear canal and external ear normal.     Left Ear: Tympanic membrane, ear canal and external ear normal.     Nose: Congestion present. No rhinorrhea.     Right Sinus: Maxillary sinus tenderness present. No frontal sinus tenderness.     Left Sinus: Maxillary sinus tenderness present. No frontal sinus tenderness.     Mouth/Throat:     Mouth: Mucous membranes are moist.     Pharynx: Oropharynx is clear. No oropharyngeal exudate or posterior oropharyngeal erythema.  Eyes:     Extraocular Movements: Extraocular movements intact.  Cardiovascular:     Rate and Rhythm: Normal rate and regular rhythm.     Pulses: Normal pulses.     Heart sounds: Normal heart sounds.  Pulmonary:     Effort: Pulmonary effort is normal.     Breath sounds: Normal breath sounds.  Genitourinary:    Comments: deferred Neurological:     Mental Status: She is alert and oriented to person, place, and time. Mental status is at baseline.      UC Treatments / Results  Labs (all labs ordered are listed, but only abnormal results are displayed) Labs Reviewed  HCG, QUANTITATIVE, PREGNANCY  POCT URINE PREGNANCY    EKG   Radiology No results found.  Procedures Procedures (including critical care time)  Medications Ordered in UC Medications - No data to display  Initial Impression / Assessment and Plan / UC Course  I have reviewed the triage vital signs and the nursing notes.  Pertinent labs & imaging results that were available during my care of the patient were reviewed by me and considered in my medical decision making (see chart for details).  Acute nonrecurrent maxillary sinusitis, missed menses, encounter for pregnancy test with result negative  Patient is in no signs of distress nor  toxic appearing.  Vital  signs are stable.  Low suspicion for pneumonia, pneumothorax or bronchitis and therefore will defer imaging.  Prednisone  and Atrovent  prescribed for management of a sinusitis.May use additional over-the-counter medications as needed for supportive care.  May follow-up with urgent care as needed if symptoms persist or worsen.  Urine pregnancy test negative, hCG level pending, patient instructed to not use steroid medication until notification of results, verbalized understanding  Note given.   Final Clinical Impressions(s) / UC Diagnoses   Final diagnoses:  Acute non-recurrent maxillary sinusitis  Missed menses     Discharge Instructions      Your symptoms today are most likely being caused by a virus and should steadily improve in time it can take up to 7 to 10 days before you truly start to see a turnaround however things will get better  Begin prednisone  every morning as directed to help reduce sinus pressure  Use nasal spray every morning and every evening for 10 days    You can take Tylenol  and/or Ibuprofen  as needed for fever reduction and pain relief.   For cough: honey 1/2 to 1 teaspoon (you can dilute the honey in water or another fluid).  You can also use guaifenesin and dextromethorphan for cough. You can use a humidifier for chest congestion and cough.  If you don't have a humidifier, you can sit in the bathroom with the hot shower running.      For sore throat: try warm salt water gargles, cepacol lozenges, throat spray, warm tea or water with lemon/honey, popsicles or ice, or OTC cold relief medicine for throat discomfort.   For congestion: take a daily anti-histamine like Zyrtec, Claritin, and a oral decongestant, such as pseudoephedrine.  You can also use Flonase 1-2 sprays in each nostril daily.   It is important to stay hydrated: drink plenty of fluids (water, gatorade/powerade/pedialyte, juices, or teas) to keep your throat moisturized and help further relieve  irritation/discomfort.    Urine pregnancy test is negative, blood work is pending, you will be notified of positive test results only and will be able to see lab work on your MyChart account  Not use steroid medication until you receive negative test results of blood work   ED Prescriptions     Medication Sig Dispense Auth. Provider   predniSONE  (STERAPRED UNI-PAK 21 TAB) 10 MG (21) TBPK tablet Take by mouth daily. Take 6 tabs by mouth daily  for 1 days, then 5 tabs for 1 days, then 4 tabs for 1 days, then 3 tabs for 1 days, 2 tabs for 1 days, then 1 tab by mouth daily for 1 days 21 tablet Cheila Wickstrom R, NP   ipratropium (ATROVENT ) 0.03 % nasal spray Place 2 sprays into both nostrils every 12 (twelve) hours. 30 mL Reena Canning, NP      PDMP not reviewed this encounter.   Reena Canning, NP 04/28/23 1416    Reena Canning, NP 09/19/23 1239

## 2023-04-30 LAB — BETA HCG QUANT (REF LAB): hCG Quant: <1 m[IU]/mL

## 2023-04-30 LAB — SPECIMEN STATUS REPORT

## 2023-11-16 ENCOUNTER — Other Ambulatory Visit: Payer: Self-pay

## 2023-11-16 ENCOUNTER — Encounter: Payer: Self-pay | Admitting: Obstetrics and Gynecology

## 2023-11-16 ENCOUNTER — Observation Stay
Admission: EM | Admit: 2023-11-16 | Discharge: 2023-11-17 | Disposition: A | Discharge status: Home / Self Care | Source: Ambulatory Visit | Admitting: Obstetrics and Gynecology

## 2023-11-16 DIAGNOSIS — B9689 Other specified bacterial agents as the cause of diseases classified elsewhere: Secondary | ICD-10-CM | POA: Diagnosis present

## 2023-11-16 DIAGNOSIS — O99212 Obesity complicating pregnancy, second trimester: Secondary | ICD-10-CM | POA: Diagnosis not present

## 2023-11-16 DIAGNOSIS — O4192X0 Disorder of amniotic fluid and membranes, unspecified, second trimester, not applicable or unspecified: Secondary | ICD-10-CM | POA: Diagnosis present

## 2023-11-16 DIAGNOSIS — O4290 Premature rupture of membranes, unspecified as to length of time between rupture and onset of labor, unspecified weeks of gestation: Principal | ICD-10-CM | POA: Diagnosis present

## 2023-11-16 DIAGNOSIS — E669 Obesity, unspecified: Secondary | ICD-10-CM | POA: Diagnosis not present

## 2023-11-16 DIAGNOSIS — Z3A27 27 weeks gestation of pregnancy: Secondary | ICD-10-CM | POA: Insufficient documentation

## 2023-11-16 DIAGNOSIS — O99210 Obesity complicating pregnancy, unspecified trimester: Secondary | ICD-10-CM | POA: Diagnosis present

## 2023-11-16 DIAGNOSIS — O23592 Infection of other part of genital tract in pregnancy, second trimester: Secondary | ICD-10-CM | POA: Insufficient documentation

## 2023-11-16 DIAGNOSIS — B3731 Acute candidiasis of vulva and vagina: Secondary | ICD-10-CM | POA: Diagnosis present

## 2023-11-16 LAB — URINALYSIS, COMPLETE (UACMP) WITH MICROSCOPIC
Bilirubin Urine: NEGATIVE
Glucose, UA: NEGATIVE mg/dL
Hgb urine dipstick: NEGATIVE
Ketones, ur: NEGATIVE mg/dL
Nitrite: NEGATIVE
Protein, ur: 30 mg/dL — AB
Specific Gravity, Urine: 1.027 (ref 1.005–1.030)
pH: 5 (ref 5.0–8.0)

## 2023-11-16 LAB — URINE DRUG SCREEN, QUALITATIVE (ARMC ONLY)
Amphetamines, Ur Screen: NOT DETECTED
Barbiturates, Ur Screen: NOT DETECTED
Benzodiazepine, Ur Scrn: NOT DETECTED
Cannabinoid 50 Ng, Ur ~~LOC~~: NOT DETECTED
Cocaine Metabolite,Ur ~~LOC~~: NOT DETECTED
MDMA (Ecstasy)Ur Screen: NOT DETECTED
Methadone Scn, Ur: NOT DETECTED
Opiate, Ur Screen: NOT DETECTED
Phencyclidine (PCP) Ur S: NOT DETECTED
Tricyclic, Ur Screen: NOT DETECTED

## 2023-11-16 LAB — WET PREP, GENITAL
Sperm: NONE SEEN
Trich, Wet Prep: NONE SEEN
WBC, Wet Prep HPF POC: >=10 — AB (ref <10)

## 2023-11-16 LAB — RUPTURE OF MEMBRANE (ROM)PLUS: Rom Plus: NEGATIVE

## 2023-11-16 MED ORDER — ACETAMINOPHEN 500 MG PO TABS: 1000.0000 mg | ORAL_TABLET | Freq: Four times a day (QID) | ORAL | Status: DC | PRN

## 2023-11-16 MED ORDER — FLUCONAZOLE 50 MG PO TABS
150.0000 mg | ORAL_TABLET | Freq: Once | ORAL | Status: AC
  Administered 2023-11-16: 150 mg via ORAL
  Filled 2023-11-16: qty 1

## 2023-11-16 MED ORDER — METRONIDAZOLE 500 MG PO TABS: 500.0000 mg | ORAL_TABLET | Freq: Two times a day (BID) | ORAL | 0 refills | Status: AC

## 2023-11-16 MED ORDER — ONDANSETRON 4 MG PO TBDP
4.0000 mg | ORAL_TABLET | Freq: Once | ORAL | Status: AC
  Administered 2023-11-16: 4 mg via ORAL
  Filled 2023-11-16: qty 1

## 2023-11-16 MED ORDER — METRONIDAZOLE 500 MG PO TABS
500.0000 mg | ORAL_TABLET | Freq: Two times a day (BID) | ORAL | Status: DC
  Administered 2023-11-16: 500 mg via ORAL
  Filled 2023-11-16: qty 1

## 2023-11-16 MED ORDER — CALCIUM CARBONATE ANTACID 500 MG PO CHEW: 2.0000 | CHEWABLE_TABLET | ORAL | Status: DC | PRN

## 2023-11-16 MED ORDER — FLUCONAZOLE 150 MG PO TABS: 150.0000 mg | ORAL_TABLET | Freq: Once | ORAL | 0 refills | Status: AC

## 2023-11-16 NOTE — OB Triage Note (Signed)
 Patient is a 28 yo, G4P1, at 27 weeks and 4 days. Patient presents with complaints of leaking of fluid and lower abdominal pressure. Pt reports experiencing an increase in discharge within the last 24 hours and wasn't sure if her water broke or not. Patient denies any vaginal bleeding. Patient reports +FM. Monitors applied and assessing. Initial fetal heart tone is 135. A Mackie CNM notified and aware of patients arrival to unit. Plan to do rom+, wet prep, and urinalysis

## 2023-11-16 NOTE — Discharge Summary (Signed)
 Ellen Hart is a 28 y.o. female. She is at [redacted]w[redacted]d gestation. Patient's last menstrual period was 05/07/2023 (approximate). 02/11/2024, by Last Menstrual Period   Prenatal care site: Prenatal provider in [place]    Chief complaint: leakage of fluid   Admission Diagnoses:  1) intrauterine pregnancy at [redacted]w[redacted]d  2) Leakage of amniotic fluid [O42.90]  Discharge Diagnoses:  Principal Problem:   Leakage of amniotic fluid Active Problems:   Obesity affecting pregnancy   Vaginal yeast infection   Bacterial vaginosis in pregnancy  HPI: [name] presents to L&D with complaints of leaking fluid for past 24 hours. She is visiting friends who live locally and usually resides in [place]  where she receives regular prenatal care. She reports she was recently taking antibioitics for a kidney infection when she started to notice symptoms of a yeast infection. She tried OTC methods per her provider's recommendations and it was not helpful. Yesterday she started having a more watery discharge with small gushes.  Her pregnancy is complicated by obesity.  She denies Contractions or Vaginal bleeding. Endorses fetal movement as active.   S: Resting comfortably. no CTX, no VB. No active LOF. Active fetal movement.   Maternal Medical History:  Past Medical Hx:  has a past medical history of Migraine and Ovarian cyst.    Past Surgical Hx:  has a past surgical history that includes Appendectomy and Wisdom tooth extraction.   Allergies  Allergen Reactions   Amoxicillin Anaphylaxis   Sumatriptan Itching     Prior to Admission medications   Medication Sig Start Date End Date Taking? Authorizing Provider  Prenatal Vit-Fe Fumarate-FA (MULTIVITAMIN-PRENATAL) 27-0.8 MG TABS tablet Take 1 tablet by mouth daily at 12 noon.   Yes [provider]  azithromycin  (ZITHROMAX ) 250 MG tablet Take 1 tablet (250 mg total) by mouth daily. Take first 2 tablets together, then 1 every day until finished. Patient not taking:  Reported on 11/16/2023 03/10/22   Arvis Jolan NOVAK, PA-C  buPROPion (WELLBUTRIN XL) 300 MG 24 hr tablet Take 300 mg by mouth every morning. Patient not taking: Reported on 11/16/2023 01/30/22   [provider]  ferrous sulfate  325 (65 FE) MG tablet Take 1 tablet (325 mg total) by mouth 2 (two) times daily with a meal. Patient not taking: Reported on 11/16/2023 12/19/19   Myron Nest, CNM  ipratropium (ATROVENT ) 0.03 % nasal spray Place 2 sprays into both nostrils every 12 (twelve) hours. Patient not taking: Reported on 11/16/2023 04/28/23   Teresa Shelba SAUNDERS, NP  lidocaine  (XYLOCAINE ) 2 % solution Use as directed 10 mLs in the mouth or throat every 4 (four) hours as needed for mouth pain. Swish over affected dentition, spit Patient not taking: Reported on 11/16/2023 04/15/21   Cuthriell, Dorn BIRCH, PA-C  predniSONE  (STERAPRED UNI-PAK 21 TAB) 10 MG (21) TBPK tablet Take by mouth daily. Take 6 tabs by mouth daily  for 1 days, then 5 tabs for 1 days, then 4 tabs for 1 days, then 3 tabs for 1 days, 2 tabs for 1 days, then 1 tab by mouth daily for 1 days Patient not taking: Reported on 11/16/2023 04/28/23   Teresa Shelba SAUNDERS, NP  prochlorperazine  (COMPAZINE ) 10 MG tablet Take 1 tablet (10 mg total) by mouth every 8 (eight) hours as needed for nausea or vomiting. Patient not taking: Reported on 11/16/2023 04/21/21   Ward, Josette N, DO  VYVANSE 30 MG capsule Take 1 capsule by mouth every morning. 03/04/23 04/03/23  [provider]    Social  History: She  reports that she has never smoked. She has never used smokeless tobacco. She reports current alcohol use. She reports that she does not currently use drugs.  Family History: family history is not on file.   Review of Systems: A full review of systems was performed and negative except as noted in the HPI.     Pertinent Results:   O:  BP (!) 112/57 (BP Location: Left Arm)   Pulse 99   Temp 98.4 F (36.9 C) (Oral)   Resp 18   LMP 05/07/2023  (Approximate)  Results for orders placed or performed during the hospital encounter of 11/16/23 (from the past 48 hours)  Wet prep, genital   Collection Time: 11/16/23 10:10 PM  Result Value Ref Range   Yeast Wet Prep HPF POC PRESENT (A) NONE SEEN   Trich, Wet Prep NONE SEEN NONE SEEN   Clue Cells Wet Prep HPF POC PRESENT (A) NONE SEEN   WBC, Wet Prep HPF POC >=10 (A) <10   Sperm NONE SEEN   Chlamydia/NGC rt PCR ([facility] only)   Collection Time: 11/16/23 10:10 PM   Specimen: Cervical/Vaginal swab; Genital  Result Value Ref Range   Specimen source GC/Chlam ENDOCERVICAL    Chlamydia Tr NOT DETECTED NOT DETECTED   N gonorrhoeae NOT DETECTED NOT DETECTED  Rupture of Membrane (ROM) Plus   Collection Time: 11/16/23 10:10 PM  Result Value Ref Range   Rom Plus NEGATIVE   Urinalysis, Complete w Microscopic -Urine, Clean Catch   Collection Time: 11/16/23 10:58 PM  Result Value Ref Range   Color, Urine YELLOW (A) YELLOW   APPearance CLOUDY (A) CLEAR   Specific Gravity, Urine 1.027 1.005 - 1.030   pH 5.0 5.0 - 8.0   Glucose, UA NEGATIVE NEGATIVE mg/dL   Hgb urine dipstick NEGATIVE NEGATIVE   Bilirubin Urine NEGATIVE NEGATIVE   Ketones, ur NEGATIVE NEGATIVE mg/dL   Protein, ur 30 (A) NEGATIVE mg/dL   Nitrite NEGATIVE NEGATIVE   Leukocytes,Ua LARGE (A) NEGATIVE   RBC / HPF 6-10 0 - 5 RBC/hpf   WBC, UA 21-50 0 - 5 WBC/hpf   Bacteria, UA RARE (A) NONE SEEN   Squamous Epithelial / HPF 0-5 0 - 5 /HPF   Mucus PRESENT    Hyphae Yeast PRESENT    Ca Oxalate Crys, UA PRESENT   Urine Drug Screen, Qualitative ([facility] only)   Collection Time: 11/16/23 10:58 PM  Result Value Ref Range   Tricyclic, Ur Screen NONE DETECTED NONE DETECTED   Amphetamines, Ur Screen NONE DETECTED NONE DETECTED   MDMA (Ecstasy)Ur Screen NONE DETECTED NONE DETECTED   Cocaine Metabolite,Ur [place] NONE DETECTED NONE DETECTED   Opiate, Ur Screen NONE DETECTED NONE DETECTED   Phencyclidine (PCP) Ur S NONE DETECTED NONE DETECTED    Cannabinoid 50 Ng, Ur [place] NONE DETECTED NONE DETECTED   Barbiturates, Ur Screen NONE DETECTED NONE DETECTED   Benzodiazepine, Ur Scrn NONE DETECTED NONE DETECTED   Methadone Scn, Ur NONE DETECTED NONE DETECTED    No results found.  Constitutional: NAD, AAOx3  PULM: nl respiratory effort Abd: gravid, non-tender Ext: Non-tender, Nonedmeatous Psych: mood appropriate, speech normal Pelvic : pooling absent, copious amount of thick, white to light green, discharge adherent to vaginal walls, moderate amount of erythema present both on vulva and vaginally DCZ:rzmcpk visually closed/long  Fetal monitor: Baseline FHR: 135 beats/min Decelerations: absent Tocometry: None Time: at least 20 minutes   Consults: None  Procedures: Fetal monitoring   Hospital Course: The  patient was admitted to Labor and Delivery Triage for observation. Rom + and  GC/CT screen was negative. Wet prep was significant for BV and yeast, consistent with pelvic exam. Urine culture is pending. She was given flagyl  and diflucan  prior to discharge. Prescription sent to preferred pharmacy. Instructed to repeat diflucan  between day 5-7 of antibiotics. Reviewed warning s/s to return to L&D with.  She was deemed stable for discharge and further outpatient management.   Discharge Condition: stable  Disposition: Discharge disposition: 01-Home or Self Care       Allergies as of 11/17/2023       Reactions   Amoxicillin Anaphylaxis   Sumatriptan Itching        Medication List     STOP taking these medications    azithromycin  250 MG tablet Commonly known as: ZITHROMAX    buPROPion 300 MG 24 hr tablet Commonly known as: WELLBUTRIN XL   ferrous sulfate  325 (65 FE) MG tablet   ipratropium 0.03 % nasal spray Commonly known as: ATROVENT    lidocaine  2 % solution Commonly known as: XYLOCAINE    predniSONE  10 MG (21) Tbpk tablet Commonly known as: STERAPRED UNI-PAK 21 TAB   prochlorperazine  10 MG tablet Commonly  known as: COMPAZINE        TAKE these medications    fluconazole  150 MG tablet Commonly known as: DIFLUCAN  Take 1 tablet (150 mg total) by mouth once for 1 dose. Start taking on: November 21, 2023   metroNIDAZOLE  500 MG tablet Commonly known as: FLAGYL  Take 1 tablet (500 mg total) by mouth every 12 (twelve) hours.   multivitamin-prenatal 27-0.8 MG Tabs tablet Take 1 tablet by mouth daily at 12 noon.   Vyvanse 30 MG capsule Generic drug: lisdexamfetamine Take 1 capsule by mouth every morning.        Follow-up Information     Primary prenatal provider. Go in 1 week(s).   Why: routine prenatal appointment               ----- Therisa CHRISTELLA Pillow, CNM  Certified Nurse Midwife [facility]

## 2023-11-17 DIAGNOSIS — B3731 Acute candidiasis of vulva and vagina: Secondary | ICD-10-CM | POA: Diagnosis present

## 2023-11-17 DIAGNOSIS — B9689 Other specified bacterial agents as the cause of diseases classified elsewhere: Secondary | ICD-10-CM | POA: Diagnosis present

## 2023-11-17 LAB — CHLAMYDIA/NGC RT PCR (ARMC ONLY)
Chlamydia Tr: NOT DETECTED
N gonorrhoeae: NOT DETECTED

## 2023-11-17 NOTE — Progress Notes (Signed)
 Discharge instructions provided to patient. Patient verbalized understanding. Pt informed to pick up medications from her pharmacy and take them as instructed. Patient discharged home with significant other in stable condition.

## 2023-11-18 LAB — URINE CULTURE
